# Patient Record
Sex: Female | Born: 1950 | Race: White | Hispanic: No | Marital: Married | Smoking: Never smoker
Health system: Southern US, Community
[De-identification: ages and names within clinical notes are randomized; demographics above are authoritative.]

## PROBLEM LIST (undated history)

## (undated) ENCOUNTER — Emergency Department (HOSPITAL_BASED_OUTPATIENT_CLINIC_OR_DEPARTMENT_OTHER): Disposition: A | Discharge status: Home / Self Care | Payer: Medicare Other

## (undated) DIAGNOSIS — E785 Hyperlipidemia, unspecified: Secondary | ICD-10-CM

## (undated) DIAGNOSIS — B019 Varicella without complication: Secondary | ICD-10-CM

## (undated) DIAGNOSIS — M419 Scoliosis, unspecified: Secondary | ICD-10-CM

## (undated) DIAGNOSIS — N951 Menopausal and female climacteric states: Secondary | ICD-10-CM

## (undated) DIAGNOSIS — D164 Benign neoplasm of bones of skull and face: Secondary | ICD-10-CM

## (undated) DIAGNOSIS — T7840XA Allergy, unspecified, initial encounter: Secondary | ICD-10-CM

## (undated) DIAGNOSIS — G4484 Primary exertional headache: Secondary | ICD-10-CM

## (undated) HISTORY — PX: WISDOM TOOTH EXTRACTION: SHX21

## (undated) HISTORY — DX: Hyperlipidemia, unspecified: E78.5

## (undated) HISTORY — DX: Menopausal and female climacteric states: N95.1

## (undated) HISTORY — DX: Scoliosis, unspecified: M41.9

## (undated) HISTORY — PX: BUNIONECTOMY: SHX129

## (undated) HISTORY — DX: Allergy, unspecified, initial encounter: T78.40XA

## (undated) HISTORY — DX: Varicella without complication: B01.9

---
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
## 1999-05-31 ENCOUNTER — Other Ambulatory Visit: Admission: RE | Admit: 1999-05-31 | Discharge: 1999-05-31 | Payer: Self-pay | Admitting: Obstetrics & Gynecology

## 1999-08-07 HISTORY — PX: HYSTEROSCOPY: SHX211

## 1999-09-26 ENCOUNTER — Other Ambulatory Visit: Admission: RE | Admit: 1999-09-26 | Discharge: 1999-09-26 | Payer: Self-pay | Admitting: Obstetrics & Gynecology

## 2000-03-21 ENCOUNTER — Other Ambulatory Visit: Admission: RE | Admit: 2000-03-21 | Discharge: 2000-03-21 | Payer: Self-pay | Admitting: Obstetrics & Gynecology

## 2000-09-27 ENCOUNTER — Other Ambulatory Visit: Admission: RE | Admit: 2000-09-27 | Discharge: 2000-09-27 | Payer: Self-pay | Admitting: Obstetrics & Gynecology

## 2001-03-07 ENCOUNTER — Other Ambulatory Visit: Admission: RE | Admit: 2001-03-07 | Discharge: 2001-03-07 | Payer: Self-pay | Admitting: Obstetrics & Gynecology

## 2001-07-18 ENCOUNTER — Other Ambulatory Visit: Admission: RE | Admit: 2001-07-18 | Discharge: 2001-07-18 | Payer: Self-pay | Admitting: Obstetrics & Gynecology

## 2002-01-26 ENCOUNTER — Other Ambulatory Visit: Admission: RE | Admit: 2002-01-26 | Discharge: 2002-01-26 | Payer: Self-pay | Admitting: Obstetrics and Gynecology

## 2002-04-30 ENCOUNTER — Encounter: Payer: Self-pay | Admitting: Obstetrics & Gynecology

## 2002-04-30 ENCOUNTER — Encounter: Admission: RE | Admit: 2002-04-30 | Discharge: 2002-04-30 | Payer: Self-pay | Admitting: Obstetrics & Gynecology

## 2002-08-25 ENCOUNTER — Other Ambulatory Visit: Admission: RE | Admit: 2002-08-25 | Discharge: 2002-08-25 | Payer: Self-pay | Admitting: Obstetrics & Gynecology

## 2003-05-03 ENCOUNTER — Other Ambulatory Visit: Admission: RE | Admit: 2003-05-03 | Discharge: 2003-05-03 | Payer: Self-pay | Admitting: Obstetrics and Gynecology

## 2003-10-07 ENCOUNTER — Other Ambulatory Visit: Admission: RE | Admit: 2003-10-07 | Discharge: 2003-10-07 | Payer: Self-pay | Admitting: Obstetrics and Gynecology

## 2004-05-08 ENCOUNTER — Other Ambulatory Visit: Admission: RE | Admit: 2004-05-08 | Discharge: 2004-05-08 | Payer: Self-pay | Admitting: Obstetrics and Gynecology

## 2005-01-08 ENCOUNTER — Ambulatory Visit: Payer: Self-pay | Admitting: Family Medicine

## 2005-02-20 ENCOUNTER — Ambulatory Visit: Payer: Self-pay | Admitting: Family Medicine

## 2005-06-19 ENCOUNTER — Other Ambulatory Visit: Admission: RE | Admit: 2005-06-19 | Discharge: 2005-06-19 | Payer: Self-pay | Admitting: Obstetrics & Gynecology

## 2006-01-04 ENCOUNTER — Ambulatory Visit: Payer: Self-pay | Admitting: Family Medicine

## 2007-01-08 ENCOUNTER — Ambulatory Visit: Payer: Self-pay | Admitting: Family Medicine

## 2007-06-16 ENCOUNTER — Encounter: Admission: RE | Admit: 2007-06-16 | Discharge: 2007-06-16 | Payer: Self-pay | Admitting: Obstetrics & Gynecology

## 2011-03-01 ENCOUNTER — Other Ambulatory Visit: Payer: Self-pay | Admitting: Obstetrics & Gynecology

## 2011-03-01 DIAGNOSIS — N644 Mastodynia: Secondary | ICD-10-CM

## 2011-03-08 ENCOUNTER — Ambulatory Visit
Admission: RE | Admit: 2011-03-08 | Discharge: 2011-03-08 | Disposition: A | Discharge status: Home / Self Care | Payer: BC Managed Care – PPO | Source: Ambulatory Visit | Attending: Obstetrics & Gynecology | Admitting: Obstetrics & Gynecology

## 2011-03-08 DIAGNOSIS — N644 Mastodynia: Secondary | ICD-10-CM

## 2012-11-04 ENCOUNTER — Ambulatory Visit: Payer: BC Managed Care – PPO | Attending: Specialist | Admitting: Physical Therapy

## 2012-11-04 DIAGNOSIS — IMO0001 Reserved for inherently not codable concepts without codable children: Secondary | ICD-10-CM | POA: Insufficient documentation

## 2012-11-04 DIAGNOSIS — R5381 Other malaise: Secondary | ICD-10-CM | POA: Insufficient documentation

## 2012-11-04 DIAGNOSIS — M12869 Other specific arthropathies, not elsewhere classified, unspecified knee: Secondary | ICD-10-CM | POA: Insufficient documentation

## 2012-11-07 ENCOUNTER — Ambulatory Visit: Payer: BC Managed Care – PPO | Admitting: *Deleted

## 2012-11-11 ENCOUNTER — Ambulatory Visit: Payer: BC Managed Care – PPO | Admitting: Physical Therapy

## 2012-11-13 ENCOUNTER — Ambulatory Visit: Payer: BC Managed Care – PPO | Admitting: Physical Therapy

## 2012-11-18 ENCOUNTER — Ambulatory Visit: Payer: BC Managed Care – PPO | Admitting: Physical Therapy

## 2013-06-17 ENCOUNTER — Telehealth: Payer: Self-pay | Admitting: *Deleted

## 2013-06-17 NOTE — Telephone Encounter (Signed)
Pt requested copy of 02/04/2013, be mailed to [facility], [address], [place], [place] [address].  Pt states seeing Dr Case for her knee and wants to take the x-ray copy.  Done.

## 2013-07-15 ENCOUNTER — Encounter: Payer: Self-pay | Admitting: Internal Medicine

## 2013-09-23 ENCOUNTER — Emergency Department (HOSPITAL_COMMUNITY)
Admission: EM | Admit: 2013-09-23 | Discharge: 2013-09-23 | Disposition: A | Discharge status: Home / Self Care | Payer: BC Managed Care – PPO | Attending: Emergency Medicine | Admitting: Emergency Medicine

## 2013-09-23 ENCOUNTER — Emergency Department (HOSPITAL_COMMUNITY): Payer: BC Managed Care – PPO

## 2013-09-23 ENCOUNTER — Encounter (HOSPITAL_COMMUNITY): Payer: Self-pay | Admitting: Emergency Medicine

## 2013-09-23 DIAGNOSIS — S300XXA Contusion of lower back and pelvis, initial encounter: Secondary | ICD-10-CM

## 2013-09-23 DIAGNOSIS — S20229A Contusion of unspecified back wall of thorax, initial encounter: Secondary | ICD-10-CM | POA: Insufficient documentation

## 2013-09-23 DIAGNOSIS — Y9323 Activity, snow (alpine) (downhill) skiing, snow boarding, sledding, tobogganing and snow tubing: Secondary | ICD-10-CM | POA: Insufficient documentation

## 2013-09-23 DIAGNOSIS — Y9289 Other specified places as the place of occurrence of the external cause: Secondary | ICD-10-CM | POA: Insufficient documentation

## 2013-09-23 DIAGNOSIS — W2209XA Striking against other stationary object, initial encounter: Secondary | ICD-10-CM | POA: Insufficient documentation

## 2013-09-23 LAB — URINALYSIS, ROUTINE W REFLEX MICROSCOPIC
Bilirubin Urine: NEGATIVE
Glucose, UA: NEGATIVE mg/dL
Hgb urine dipstick: NEGATIVE
Ketones, ur: NEGATIVE mg/dL
Nitrite: NEGATIVE
Protein, ur: NEGATIVE mg/dL
Specific Gravity, Urine: 1.01 (ref 1.005–1.030)
Urobilinogen, UA: 0.2 mg/dL (ref 0.0–1.0)
pH: 7.5 (ref 5.0–8.0)

## 2013-09-23 LAB — URINE MICROSCOPIC-ADD ON

## 2013-09-23 MED ORDER — DIAZEPAM 5 MG PO TABS: 5.0000 mg | ORAL_TABLET | Freq: Three times a day (TID) | ORAL | Status: DC | PRN

## 2013-09-23 MED ORDER — DIAZEPAM 5 MG PO TABS
5.0000 mg | ORAL_TABLET | Freq: Once | ORAL | Status: AC
  Administered 2013-09-23: 5 mg via ORAL
  Filled 2013-09-23: qty 1

## 2013-09-23 MED ORDER — IBUPROFEN 800 MG PO TABS: 800.0000 mg | ORAL_TABLET | Freq: Three times a day (TID) | ORAL | Status: DC | PRN

## 2013-09-23 NOTE — ED Provider Notes (Signed)
CSN: [id number]     Arrival date & time 09/23/13  1001 History  This chart was scribed for non-physician practitioner, Clayton Bibles, PA-C working with Hoy Morn, MD by Frederich Balding, ED scribe. This patient was seen in room TR05C/TR05C and the patient's care was started at 11:05 AM.   Chief Complaint  Patient presents with  . Back Pain   The history is provided by the patient. No language interpreter was used.   HPI Comments: Christy Kelly is a 63 y.o. female who presents to the Emergency Department complaining of lower back injury that occurred last night. Pt was sledding down a hill and hit a very small tree with her back and states the tree is now broken in half. She has sudden onset, worsening right lower back pain that intermittently shoots down her right leg when bearing weight. Bearing weight and certain movements worsen the pain. Pt has taken ibuprofen with some relief. Denies fever, abdominal pain, emesis, weakness, numbness, tingling, difficulty urinating, saddle anesthesia, dysuria, bowel or bladder incontinence. Denies history of back problems, cancer or IV drug use.   History reviewed. No pertinent past medical history. Past Surgical History  Procedure Laterality Date  . Tonsillectomy     No family history on file. History  Substance Use Topics  . Smoking status: Never Smoker   . Smokeless tobacco: Not on file  . Alcohol Use: No   OB History   Grav Para Term Preterm Abortions TAB SAB Ect Mult Living                 Review of Systems  Constitutional: Negative for fever.  Gastrointestinal: Negative for vomiting and abdominal pain.  Genitourinary: Negative for dysuria and difficulty urinating.       Negative for bowel or bladder incontinence.   Musculoskeletal: Positive for back pain and myalgias.  Neurological: Negative for weakness and numbness.  All other systems reviewed and are negative.   Allergies  Codeine  Home Medications  No current outpatient  prescriptions on file.  BP 122/77  Pulse 99  Temp(Src) 98 F (36.7 C) (Oral)  Resp 16  Ht 5\' 8"  (1.727 m)  Wt 150 lb (68.04 kg)  BMI 22.81 kg/m2  SpO2 97%  Physical Exam  Nursing note and vitals reviewed. Constitutional: She appears well-developed and well-nourished. No distress.  HENT:  Head: Normocephalic and atraumatic.  Neck: Neck supple.  Pulmonary/Chest: Effort normal.  Abdominal: Soft. She exhibits no distension and no mass. There is no tenderness. There is no rebound and no guarding.  Musculoskeletal:  Spine nontender, no crepitus, or stepoffs.  Lower extremities:  Strength 5/5, sensation intact, distal pulses intact. Small contusion over right lower back. Generalized tenderness throughout right lower back.   Neurological: She is alert.  Skin: She is not diaphoretic.    ED Course  Procedures (including critical care time)  DIAGNOSTIC STUDIES: Oxygen Saturation is 97% on RA, normal by my interpretation.    COORDINATION OF CARE: 11:11 AM-Discussed treatment plan which includes a muscle relaxer and ibuprofen with pt at bedside and pt agreed to plan. Advised pt to follow up with her PCP.   Labs Review Labs Reviewed  URINALYSIS, ROUTINE W REFLEX MICROSCOPIC - Abnormal; Notable for the following:    Leukocytes, UA LARGE (*)    All other components within normal limits  URINE MICROSCOPIC-ADD ON - Abnormal; Notable for the following:    Squamous Epithelial / LPF FEW (*)    Bacteria, UA FEW (*)  All other components within normal limits  URINE CULTURE   Imaging Review Dg Lumbar Spine Complete  09/23/2013   CLINICAL DATA:  Pain post trauma  EXAM: LUMBAR SPINE - COMPLETE 4+ VIEW  COMPARISON:  None.  FINDINGS: Frontal, lateral, spot lumbosacral lateral, and bilateral oblique views were obtained. There are 5 non-rib-bearing lumbar type vertebral bodies. There is mild levoscoliosis. There is no fracture or spondylolisthesis. Disc spaces appear intact. There is facet  osteoarthritic change at L4-5 and L5-S1 bilaterally.  There is calcification to the left of I4-5 of uncertain etiology.  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 Mild scoliosis. No fracture or spondylolisthesis. Calcification in the left abdomen of uncertain etiology. Given this location, pancreatic calcification is questioned. Question history of chronic pancreatitis.   Electronically Signed   By: Lowella Grip M.D.   On: 09/23/2013 10:58    EKG Interpretation   None      Pt feeling much better after valium.  Pain reduced to 3/10.   MDM   Final diagnoses:  Contusion of lower back    Pt with pain in right lower back that began after hitting a tree sledding down the hill (was on a sledding disc and it spun around causing her to go back first into the tree)  Xray negative.  Neurovascularly intact.  Likely contusion.  No hematuria.  Doubt kidney contusion.  There are 11-20 WBC but few bacteria.  Pt is without urinary symptoms.  Will defer treatment pending culture.  Discussed result, findings, treatment, and follow up  with patient.  Pt given return precautions.  Pt verbalizes understanding and agrees with plan.      I personally performed the services described in this documentation, which was scribed in my presence. The recorded information has been reviewed and is accurate.   [place], PA-C 09/23/13 1643

## 2013-09-23 NOTE — ED Notes (Signed)
Pt reports she struck right lower back on a tree in a sledding accident last pm. States has difficulty walking. Appears very uncomfortable. Assisted pt to bathroom.

## 2013-09-23 NOTE — Discharge Instructions (Signed)
Read the information below.  Use the prescribed medication as directed.  Please discuss all new medications with your pharmacist.  You may return to the Emergency Department at any time for worsening condition or any new symptoms that concern you.   If you develop fevers, loss of control of bowel or bladder, weakness or numbness in your legs, or are unable to walk, return to the ER for a recheck.    Contusion A contusion is a deep bruise. Contusions are the result of an injury that caused bleeding under the skin. The contusion may turn blue, purple, or yellow. Minor injuries will give you a painless contusion, but more severe contusions may stay painful and swollen for a few weeks.  CAUSES  A contusion is usually caused by a blow, trauma, or direct force to an area of the body. SYMPTOMS   Swelling and redness of the injured area.  Bruising of the injured area.  Tenderness and soreness of the injured area.  Pain. DIAGNOSIS  The diagnosis can be made by taking a history and physical exam. An X-ray, CT scan, or MRI may be needed to determine if there were any associated injuries, such as fractures. TREATMENT  Specific treatment will depend on what area of the body was injured. In general, the best treatment for a contusion is resting, icing, elevating, and applying cold compresses to the injured area. Over-the-counter medicines may also be recommended for pain control. Ask your caregiver what the best treatment is for your contusion. HOME CARE INSTRUCTIONS   Put ice on the injured area.  Put ice in a plastic bag.  Place a towel between your skin and the bag.  Leave the ice on for 15-20 minutes, 03-04 times a day.  Only take over-the-counter or prescription medicines for pain, discomfort, or fever as directed by your caregiver. Your caregiver may recommend avoiding anti-inflammatory medicines (aspirin, ibuprofen, and naproxen) for 48 hours because these medicines may increase  bruising.  Rest the injured area.  If possible, elevate the injured area to reduce swelling. SEEK IMMEDIATE MEDICAL CARE IF:   You have increased bruising or swelling.  You have pain that is getting worse.  Your swelling or pain is not relieved with medicines. MAKE SURE YOU:   Understand these instructions.  Will watch your condition.  Will get help right away if you are not doing well or get worse. Document Released: 05/02/2005 Document Revised: 10/15/2011 Document Reviewed: 05/28/2011 [facility] Patient Information 2014 [place], [place].

## 2013-09-23 NOTE — ED Provider Notes (Signed)
Medical screening examination/treatment/procedure(s) were performed by non-physician practitioner and as supervising physician I was immediately available for consultation/collaboration.  EKG Interpretation   None         Salisa Broz M Imogene Gravelle, MD 09/23/13 1706 

## 2013-09-23 NOTE — ED Notes (Signed)
Pt reports was sledding last night down hill and at bottom of hill hit small tree that was laid across. Pt broke tree and now having excruciating pain to lower back. Pain shooting down right leg. Denies numbness/tingling/incontinence. States unable to bear weight on right leg.

## 2013-09-24 LAB — URINE CULTURE: Colony Count: 75000

## 2013-11-11 ENCOUNTER — Encounter: Payer: Self-pay | Admitting: Internal Medicine

## 2013-12-24 ENCOUNTER — Ambulatory Visit (AMBULATORY_SURGERY_CENTER): Payer: Self-pay | Admitting: *Deleted

## 2013-12-24 VITALS — Ht 68.5 in | Wt 147.6 lb

## 2013-12-24 DIAGNOSIS — Z1211 Encounter for screening for malignant neoplasm of colon: Secondary | ICD-10-CM

## 2013-12-24 MED ORDER — MOVIPREP 100 G PO SOLR: 1.0000 | Freq: Once | ORAL | Status: DC

## 2013-12-24 NOTE — Progress Notes (Signed)
Denies allergies to eggs or soy products. Denies complications with sedation or anesthesia. Denies O2 use. Denies use of diet or weight loss medications.  Emmi instructions given for colonoscopy.  

## 2013-12-25 ENCOUNTER — Encounter: Payer: Self-pay | Admitting: Internal Medicine

## 2013-12-29 ENCOUNTER — Encounter: Payer: Self-pay | Admitting: Internal Medicine

## 2014-01-07 ENCOUNTER — Ambulatory Visit (AMBULATORY_SURGERY_CENTER): Payer: BC Managed Care – PPO | Admitting: Internal Medicine

## 2014-01-07 ENCOUNTER — Encounter: Payer: Self-pay | Admitting: Internal Medicine

## 2014-01-07 VITALS — BP 127/81 | HR 56 | Temp 97.6°F | Resp 15 | Ht 68.5 in | Wt 147.0 lb

## 2014-01-07 DIAGNOSIS — Z1211 Encounter for screening for malignant neoplasm of colon: Secondary | ICD-10-CM

## 2014-01-07 MED ORDER — SODIUM CHLORIDE 0.9 % IV SOLN: 500.0000 mL | INTRAVENOUS | Status: DC

## 2014-01-07 NOTE — Patient Instructions (Signed)
YOU HAD AN ENDOSCOPIC PROCEDURE TODAY AT THE [facility]: Refer to the procedure report that was given to you for any specific questions about what was found during the examination.  If the procedure report does not answer your questions, please call your gastroenterologist to clarify.  If you requested that your care partner not be given the details of your procedure findings, then the procedure report has been included in a sealed envelope for you to review at your convenience later.  YOU SHOULD EXPECT: Some feelings of bloating in the abdomen. Passage of more gas than usual.  Walking can help get rid of the air that was put into your GI tract during the procedure and reduce the bloating. If you had a lower endoscopy (such as a colonoscopy or flexible sigmoidoscopy) you may notice spotting of blood in your stool or on the toilet paper. If you underwent a bowel prep for your procedure, then you may not have a normal bowel movement for a few days.  DIET: Your first meal following the procedure should be a light meal and then it is ok to progress to your normal diet.  A half-sandwich or bowl of soup is an example of a good first meal.  Heavy or fried foods are harder to digest and may make you feel nauseous or bloated.  Likewise meals heavy in dairy and vegetables can cause extra gas to form and this can also increase the bloating.  Drink plenty of fluids but you should avoid alcoholic beverages for 24 hours.  ACTIVITY: Your care partner should take you home directly after the procedure.  You should plan to take it easy, moving slowly for the rest of the day.  You can resume normal activity the day after the procedure however you should NOT DRIVE or use heavy machinery for 24 hours (because of the sedation medicines used during the test).    SYMPTOMS TO REPORT IMMEDIATELY: A gastroenterologist can be reached at any hour.  During normal business hours, 8:30 AM to 5:00 PM Monday through Friday,  call [phone].  After hours and on weekends, please call the GI answering service at [phone] who will take a message and have the physician on call contact you.   Following lower endoscopy (colonoscopy or flexible sigmoidoscopy):  Excessive amounts of blood in the stool  Significant tenderness or worsening of abdominal pains  Swelling of the abdomen that is new, acute  Fever of 100F or higher   FOLLOW UP: If any biopsies were taken you will be contacted by phone or by letter within the next 1-3 weeks.  Call your gastroenterologist if you have not heard about the biopsies in 3 weeks.  Our staff will call the home number listed on your records the next business day following your procedure to check on you and address any questions or concerns that you may have at that time regarding the information given to you following your procedure. This is a courtesy call and so if there is no answer at the home number and we have not heard from you through the emergency physician on call, we will assume that you have returned to your regular daily activities without incident.  SIGNATURES/CONFIDENTIALITY: You and/or your care partner have signed paperwork which will be entered into your electronic medical record.  These signatures attest to the fact that that the information above on your After Visit Summary has been reviewed and is understood.  Full responsibility of the confidentiality of   this discharge information lies with you and/or your care-partner.     Handouts were given to your care partner on  diverticulosis, a high fiber diet with liberal fluid intake and hemorrhoids. You may resume your current medications today. Please call if any questions or concerns.   

## 2014-01-07 NOTE — Op Note (Signed)
[place]  [address] [place], [address]   COLONOSCOPY PROCEDURE REPORT  PATIENT: Christy Kelly, Christy Kelly  MR#: [id number] BIRTHDATE: July 09, 1951 , 62  yrs. old GENDER: Female ENDOSCOPIST: Lafayette Dragon, MD REFERRED KG:URKYH Vicente Serene, [facility] , Dr Edrick Oh PROCEDURE DATE:  01/07/2014 PROCEDURE:   Colonoscopy, screening First Screening Colonoscopy - Avg.  risk and is 50 yrs.  old or older - No.  Prior Negative Screening - Now for repeat screening. 10 or more years since last screening  History of Adenoma - Now for follow-up colonoscopy & has been > or = to 3 yrs.  N/A  Polyps Removed Today? No.  Recommend repeat exam, <10 yrs? No. ASA CLASS:   Class I INDICATIONS:Average risk patient for colon cancer and last colonoscopy in 2005 was normal. MEDICATIONS: MAC sedation, administered by CRNA and Propofol (Diprivan) 240 mg IV  DESCRIPTION OF PROCEDURE:   After the risks benefits and alternatives of the procedure were thoroughly explained, informed consent was obtained.  A digital rectal exam revealed no abnormalities of the rectum.   The LB [id number]  endoscope was introduced through the anus and advanced to the cecum, which was identified by both the appendix and ileocecal valve. No adverse events experienced.   The quality of the prep was excellent, using MoviPrep  The instrument was then slowly withdrawn as the colon was fully examined.      COLON FINDINGS: Mild diverticulosis was noted in the sigmoid colon. Small internal hemorrhoids were found.  Retroflexed views revealed no abnormalities. The time to cecum=7 minutes 05 seconds. Withdrawal time=6 minutes 21 seconds.  The scope was withdrawn and the procedure completed. COMPLICATIONS: There were no complications.  ENDOSCOPIC IMPRESSION: 1.   Mild diverticulosis was noted in the sigmoid colon 2.   Small internal hemorrhoids  RECOMMENDATIONS: high fiber diet Recall colonoscopy in 10 years   eSigned:   Lafayette Dragon, MD 01/07/2014 9:34 AM   cc:   PATIENT NAME:  Christy Kelly, Christy Kelly MR#: [id number]

## 2014-01-07 NOTE — Progress Notes (Signed)
Report to PACU, RN, vss, BBS= Clear.  

## 2014-01-07 NOTE — Progress Notes (Signed)
No complaints noted in the recovery room. Maw   

## 2014-01-08 ENCOUNTER — Telehealth: Payer: Self-pay | Admitting: *Deleted

## 2014-01-08 NOTE — Telephone Encounter (Signed)
  Follow up Call-  Call back number 01/07/2014  Post procedure Call Back phone  # [phone]  Permission to leave phone message Yes     Patient questions:  Do you have a fever, pain , or abdominal swelling? no Pain Score  0 *  Have you tolerated food without any problems? yes  Have you been able to return to your normal activities? yes  Do you have any questions about your discharge instructions: Diet   no Medications  no Follow up visit  no  Do you have questions or concerns about your Care? no  Actions: * If pain score is 4 or above: No action needed, pain <4.

## 2015-09-05 DIAGNOSIS — G4484 Primary exertional headache: Secondary | ICD-10-CM

## 2015-09-05 HISTORY — DX: Primary exertional headache: G44.84

## 2015-10-12 ENCOUNTER — Ambulatory Visit (INDEPENDENT_AMBULATORY_CARE_PROVIDER_SITE_OTHER): Payer: BLUE CROSS/BLUE SHIELD | Admitting: Diagnostic Neuroimaging

## 2015-10-12 ENCOUNTER — Encounter: Payer: Self-pay | Admitting: Diagnostic Neuroimaging

## 2015-10-12 VITALS — BP 116/76 | HR 91 | Ht 68.5 in | Wt 149.4 lb

## 2015-10-12 DIAGNOSIS — R51 Headache: Secondary | ICD-10-CM

## 2015-10-12 DIAGNOSIS — G4484 Primary exertional headache: Secondary | ICD-10-CM

## 2015-10-12 NOTE — Progress Notes (Signed)
[facility]  PATIENT: Christy Kelly DOB: 09/28/1950  REFERRING CLINICIAN: Hemberg HISTORY FROM: patient  REASON FOR VISIT: new consult    HISTORICAL  CHIEF COMPLAINT:  Chief Complaint  Patient presents with  . Headache    rm 7, New pt, "headache began 04/2015, exertional/cough headache""    HISTORY OF PRESENT ILLNESS:   65 year old right-handed female, otherwise healthy, or for evaluation of new onset headache since September 2016. She was on vacation, had laughing episode, and then developed pressure type headache. Ever since that time patient has had situations were cough, strain, bending, sneezing triggers a frontal pressure-like headache. Headaches can last for a few minutes at a time. Symptoms can occur on daily or multiple per day basis. She avoids straining or physical exertion activities patient is able to void headaches.  December 2016 patient had some sinus infection problems treated with antibiotics.  A she has also had follow-up eye exams with no specific problems.  Patient denies nausea, vomiting, photophobia, phonophobia. She takes Tylenol 3-4 tablets per week for more severe headache problems. 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 Patient denies any history of migraine headaches. No family history of headaches.  Just prior to September 2016 when headaches or starting patient did start taking a new supplement consisting of beet extract and tumor. Patient has started using less of this extracted last week and feels that her headaches may have slightly improved.    REVIEW OF SYSTEMS: Full 14 system review of systems performed and negative with exception of: Headache.  ALLERGIES: Allergies  Allergen Reactions  . Codeine Itching and Nausea And Vomiting    HOME MEDICATIONS: Outpatient Prescriptions Prior to Visit  Medication Sig Dispense Refill    . aspirin EC 81 MG tablet Take 81 mg by mouth daily.    Marland Kitchen CALCIUM PO Take 1 tablet by mouth daily.    . Cetirizine HCl (ZYRTEC PO) Take by mouth.    Marland Kitchen GLUCOSAMINE CHONDROITIN COMPLX PO Take 2 tablets by mouth daily.    Marland Kitchen MAGNESIUM PO Take 1 tablet by mouth daily.    . Omega-3 Fatty Acids (FISH OIL) 1000 MG CAPS Take 1,000 mg by mouth daily.    Marland Kitchen OVER THE COUNTER MEDICATION Take 2 tablets by mouth daily.    . vitamin E 400 UNIT capsule Take 400 Units by mouth daily.    . Cholecalciferol (D3 ADULT PO) Take by mouth.    Marland Kitchen ibuprofen (ADVIL,MOTRIN) 200 MG tablet Take 600 mg by mouth every 6 (six) hours as needed for moderate pain.    Marland Kitchen ibuprofen (ADVIL,MOTRIN) 800 MG tablet Take 1 tablet (800 mg total) by mouth every 8 (eight) hours as needed for mild pain or moderate pain. 15 tablet 0   No facility-administered medications prior to visit.    PAST MEDICAL HISTORY: No past medical history on file.  PAST SURGICAL HISTORY: Past Surgical History  Procedure Laterality Date  . Tonsillectomy  1962  . Bunionectomy    . Hysteroscopy  2001    FAMILY HISTORY: Family History  Problem Relation Age of Onset  . Colon cancer Neg Hx   . Esophageal cancer Neg Hx   . Rectal cancer Neg Hx   . Stomach cancer Neg Hx   . Stroke Father     SOCIAL HISTORY:  Social History   Social History  . Marital Status: Married    Spouse  Name: Richardson Landry  . Number of Children: 2  . Years of Education: 13   Occupational History  .      [employer] services   Social History Main Topics  . Smoking status: Never Smoker   . Smokeless tobacco: Never Used  . Alcohol Use: No  . Drug Use: No  . Sexual Activity: Not on file   Other Topics Concern  . Not on file   Social History Narrative   Lives with husband at home   Caffeine use- 1 glass tea daily at times     PHYSICAL EXAM  GENERAL EXAM/CONSTITUTIONAL: Vitals:  Filed Vitals:   10/12/15 0953  BP: 116/76  Pulse: 91  Height: 5' 8.5" (1.74 m)   Weight: 149 lb 6.4 oz (67.767 kg)     Body mass index is 22.38 kg/(m^2).  Visual Acuity Screening   Right eye Left eye Both eyes  Without correction:     With correction: 20/30 20/30      Patient is in no distress; well developed, nourished and groomed; neck is supple  CARDIOVASCULAR:  Examination of carotid arteries is normal; no carotid bruits  Regular rate and rhythm, no murmurs  Examination of peripheral vascular system by observation and palpation is normal  EYES:  Ophthalmoscopic exam of optic discs and posterior segments is normal; no papilledema or hemorrhages  MUSCULOSKELETAL:  Gait, strength, tone, movements noted in Neurologic exam below  NEUROLOGIC: MENTAL STATUS:  No flowsheet data found.  awake, alert, oriented to person, place and time  recent and remote memory intact  normal attention and concentration  language fluent, comprehension intact, naming intact,   fund of knowledge appropriate  CRANIAL NERVE:   2nd - no papilledema on fundoscopic exam  2nd, 3rd, 4th, 6th - pupils equal and reactive to light, visual fields full to confrontation, extraocular muscles intact, no nystagmus  5th - facial sensation symmetric  7th - facial strength symmetric  8th - hearing intact  9th - palate elevates symmetrically, uvula midline  11th - shoulder shrug symmetric  12th - tongue protrusion midline  MOTOR:   normal bulk and tone, full strength in the BUE, BLE  SENSORY:   normal and symmetric to light touch, temperature, vibration  COORDINATION:   finger-nose-finger, fine finger movements normal  REFLEXES:   deep tendon reflexes present and symmetric  GAIT/STATION:   narrow based gait; able to walk tandem; romberg is negative    DIAGNOSTIC DATA (LABS, IMAGING, TESTING) - I reviewed patient records, labs, notes, testing and imaging myself where available.  No results found for: WBC, HGB, HCT, MCV, PLT No results found for: NA, K,  CL, CO2, GLUCOSE, BUN, CREATININE, CALCIUM, PROT, ALBUMIN, AST, ALT, ALKPHOS, BILITOT, GFRNONAA, GFRAA No results found for: CHOL, HDL, LDLCALC, LDLDIRECT, TRIG, CHOLHDL No results found for: HGBA1C No results found for: VITAMINB12 No results found for: TSH   04/29/14 MRI lumbar [I reviewed images myself and agree with interpretation. -VRP]  - Shallow lateral foraminal and extraforaminal disc protrusion on the left at L5-S1 potentially irritating the left L5 root.  09/22/15 MRI brain (with and without)  - normal     ASSESSMENT AND PLAN  65 y.o. year old female here with new-onset frontal pressure headaches in September 2016 triggered by cough, sneezing, straining and exertion. Patient may have started taking the supplement extract consisting of beet extract and turmeric prior to onset of headaches. No other specific triggering factors. Neurologic examination MRI of the brain are unremarkable.  May represent primary exertional headache versus secondary headache due to new supplement usage.  Ddx: Primary exertional headache versus secondary headache from supplement extract vs other high pressure HA (pseudotumor cerebri)  1. Exertional headache      PLAN: - monitor symptoms - consider reducing beet extract supplement (which can increase nitric oxide levels and in turn cause headaches)  Return in about 6 weeks (around 11/23/2015).    Penni Bombard, MD [id number], [id number] AM Certified in Neurology, Neurophysiology and Neuroimaging  [facility] [address] [place], [place] [address] [phone]

## 2015-10-12 NOTE — Patient Instructions (Signed)

## 2015-10-17 DIAGNOSIS — D164 Benign neoplasm of bones of skull and face: Secondary | ICD-10-CM

## 2015-10-17 HISTORY — DX: Benign neoplasm of bones of skull and face: D16.4

## 2016-11-30 ENCOUNTER — Telehealth: Payer: Self-pay

## 2016-11-30 NOTE — Telephone Encounter (Signed)
SENT NOTES TO SCHEDULING 

## 2016-12-12 ENCOUNTER — Encounter: Payer: Self-pay | Admitting: Cardiology

## 2016-12-12 NOTE — Progress Notes (Signed)
Cardiology Office Note  Date: 12/13/2016   ID: Christy Kelly, DOB Dec 27, 1950, MRN [id number]  PCP: Dione Housekeeper, MD  Consulting Cardiologist: Rozann Lesches, MD   Chief Complaint  Patient presents with  . Chest discomfort    History of Present Illness: Christy Kelly is a 66 y.o. female referred for cardiology consultation by Dr. Edrick Oh for the assessment of chest pain. Her husband is a patient of mine. She states that she has had intermittent episodes of chest discomfort, largely right-sided and "achy" in quality somewhat like a sore muscle. The initial episode woke her up from sleep and lasted about 10 minutes. Subsequent to this it has felt more like a tightness, and has been sporadic without obvious precipitant. She also feels more short of breath with activity such as walking uphill and has been fatigued.  She states that her father died with strokes in his late 81s. She has siblings with elevated lipids and diabetes mellitus, but no obvious CAD. She takes omega-3 supplements, recent triglycerides normal and LDL 119. She has never smoked and has no personal history of hypertension or diabetes mellitus. Her recent ECG did not show any acute abnormalities.  Recent lab work from April is outlined below.  Past Medical History:  Diagnosis Date  . Hyperlipidemia   . Menopausal state   . Scoliosis     Past Surgical History:  Procedure Laterality Date  . BUNIONECTOMY    . HYSTEROSCOPY  2001  . TONSILLECTOMY  1962    Current Outpatient Prescriptions  Medication Sig Dispense Refill  . aspirin EC 81 MG tablet Take 81 mg by mouth daily.    Marland Kitchen CALCIUM PO Take 1 tablet by mouth daily.    . Cetirizine HCl (ZYRTEC PO) Take by mouth.    Marland Kitchen MAGNESIUM PO Take 500 mg by mouth daily.     . Omega-3 Fatty Acids (FISH OIL) 1000 MG CAPS Take 1,000 mg by mouth daily.    Marland Kitchen OVER THE COUNTER MEDICATION Take 2 tablets by mouth daily.    . vitamin B-12 (CYANOCOBALAMIN) 250 MCG tablet Take 250  mcg by mouth daily.    . vitamin E 400 UNIT capsule Take 400 Units by mouth daily.     No current facility-administered medications for this visit.    Allergies:  Codeine   Social History: The patient  reports that she has never smoked. She has never used smokeless tobacco. She reports that she does not drink alcohol or use drugs.   Family History: The patient's family history includes Hypercholesterolemia in her brother; Hypertension in her brother; Stroke in her father.   ROS:  Please see the history of present illness. Otherwise, complete review of systems is positive for intermittent foot swelling, tingling in her hands and feet.  All other systems are reviewed and negative.   Physical Exam: VS:  BP 110/76 (BP Location: Right Arm)   Pulse 96   Ht 5\' 8"  (1.727 m)   Wt 147 lb (66.7 kg)   SpO2 98%   BMI 22.35 kg/m , BMI Body mass index is 22.35 kg/m.  Wt Readings from Last 3 Encounters:  12/13/16 147 lb (66.7 kg)  10/12/15 149 lb 6.4 oz (67.8 kg)  01/07/14 147 lb (66.7 kg)    General: Normally nourished appearing woman, appears comfortable at rest. HEENT: Conjunctiva and lids normal, oropharynx clear. Neck: Supple, no elevated JVP or carotid bruits, no thyromegaly. Lungs: Clear to auscultation, nonlabored breathing at rest. Cardiac: Regular rate  and rhythm, no S3, soft systolic murmur, no pericardial rub. Abdomen: Soft, nontender, bowel sounds present, no guarding or rebound. Extremities: No pitting edema, distal pulses 2+. Skin: Warm and dry. Musculoskeletal: No kyphosis. Neuropsychiatric: Alert and oriented x3, affect grossly appropriate.  ECG: I personally reviewed the tracing from 11/29/2016 which showed normal sinus rhythm.  Recent Labwork:  April 2018: Hemoglobin 11.2, platelets 228, BUN 13, creatinine 0.72, potassium 4.4, AST 32, ALT 25, cholesterol 185, triglycerides 54, HDL 55, LDL 119, TSH 3.18  Assessment and Plan:  1. Dyspnea on exertion and fatigue, also  intermittent precordial pain with overall atypical features. Patient's father died in his late 41s after several strokes suggesting premature atherosclerosis. She does not have any personal history of tobacco use, hypertension, or diabetes mellitus, takes omega-3 supplements for mild hyperlipidemia per chart. ECG does not show any acute ST segment changes. She has not undergone any ischemic testing and we will arrange an exercise echocardiogram for evaluation.  2. Reported mild hyperlipidemia, on omega-3 supplements. Recent triglycerides normal and LDL 119.  Current medicines were reviewed with the patient today.   Orders Placed This Encounter  Procedures  . ECHOCARDIOGRAM STRESS TEST    Disposition: Call with test results.  Signed, Satira Sark, MD, [facility] 12/13/2016 11:17 AM    [place] [facility] [address], [place], [place] [address] Phone: [phone]; Fax: [phone]

## 2016-12-13 ENCOUNTER — Encounter: Payer: Self-pay | Admitting: Cardiology

## 2016-12-13 ENCOUNTER — Ambulatory Visit (INDEPENDENT_AMBULATORY_CARE_PROVIDER_SITE_OTHER): Payer: BLUE CROSS/BLUE SHIELD | Admitting: Cardiology

## 2016-12-13 VITALS — BP 110/76 | HR 96 | Ht 68.0 in | Wt 147.0 lb

## 2016-12-13 DIAGNOSIS — E782 Mixed hyperlipidemia: Secondary | ICD-10-CM | POA: Diagnosis not present

## 2016-12-13 DIAGNOSIS — Z8249 Family history of ischemic heart disease and other diseases of the circulatory system: Secondary | ICD-10-CM

## 2016-12-13 DIAGNOSIS — R0600 Dyspnea, unspecified: Secondary | ICD-10-CM

## 2016-12-13 DIAGNOSIS — R0609 Other forms of dyspnea: Secondary | ICD-10-CM

## 2016-12-13 DIAGNOSIS — R0789 Other chest pain: Secondary | ICD-10-CM

## 2016-12-13 NOTE — Patient Instructions (Signed)
Medication Instructions:  Your physician recommends that you continue on your current medications as directed. Please refer to the Current Medication list given to you today.  Labwork: NONE  Testing/Procedures: Your physician has requested that you have a stress echocardiogram. For further information please visit [email or web address]. Please follow instruction sheet as given.  Follow-Up: Your physician recommends that you schedule a follow-up appointment PENDING TEST RESULTS   Any Other Special Instructions Will Be Listed Below (If Applicable).  If you need a refill on your cardiac medications before your next appointment, please call your pharmacy.

## 2016-12-19 ENCOUNTER — Ambulatory Visit (HOSPITAL_COMMUNITY)
Admission: RE | Admit: 2016-12-19 | Discharge: 2016-12-19 | Disposition: A | Discharge status: Home / Self Care | Payer: BLUE CROSS/BLUE SHIELD | Source: Ambulatory Visit | Attending: Cardiology | Admitting: Cardiology

## 2016-12-19 DIAGNOSIS — R0789 Other chest pain: Secondary | ICD-10-CM | POA: Diagnosis not present

## 2016-12-19 NOTE — Progress Notes (Signed)
*  PRELIMINARY RESULTS* Echocardiogram Echocardiogram Stress Test has been performed.  Christy Kelly 12/19/2016, 11:04 AM

## 2016-12-20 LAB — ECHOCARDIOGRAM STRESS TEST
Estimated workload: 10.9 METS
Exercise duration (min): 9 min
Exercise duration (sec): 23 s
MPHR: 157 {beats}/min
Peak HR: 166 {beats}/min
Percent HR: 105 %
RPE: 15
Rest HR: 85 {beats}/min

## 2018-08-12 LAB — HEMOGLOBIN A1C: Hemoglobin A1C: 5.5

## 2018-08-12 LAB — CBC AND DIFFERENTIAL: Platelets: 217 (ref 150–399)

## 2018-08-12 LAB — BASIC METABOLIC PANEL
BUN: 12 (ref 4–21)
Creatinine: 0.9 (ref 0.5–1.1)
Potassium: 4.2 (ref 3.4–5.3)
Sodium: 140 (ref 137–147)

## 2018-08-12 LAB — HEPATIC FUNCTION PANEL
ALT: 24 (ref 7–35)
AST: 23 (ref 13–35)
Bilirubin, Total: 0.7

## 2018-08-13 LAB — LIPID PANEL
Cholesterol: 219 — AB (ref 0–200)
HDL: 167 — AB (ref 35–70)
LDL Cholesterol: 147

## 2018-08-14 LAB — HM MAMMOGRAPHY

## 2018-08-14 LAB — HM PAP SMEAR

## 2018-08-15 LAB — HM DEXA SCAN

## 2019-02-13 ENCOUNTER — Other Ambulatory Visit: Payer: Self-pay

## 2019-02-13 ENCOUNTER — Ambulatory Visit: Payer: Medicare Other | Admitting: Family Medicine

## 2019-02-13 ENCOUNTER — Encounter: Payer: Self-pay | Admitting: Family Medicine

## 2019-02-13 VITALS — BP 120/84 | HR 93 | Temp 97.7°F | Resp 17 | Ht 68.75 in | Wt 161.4 lb

## 2019-02-13 DIAGNOSIS — Z7689 Persons encountering health services in other specified circumstances: Secondary | ICD-10-CM | POA: Diagnosis not present

## 2019-02-13 DIAGNOSIS — G629 Polyneuropathy, unspecified: Secondary | ICD-10-CM | POA: Diagnosis not present

## 2019-02-13 DIAGNOSIS — M5136 Other intervertebral disc degeneration, lumbar region: Secondary | ICD-10-CM | POA: Diagnosis not present

## 2019-02-13 NOTE — Progress Notes (Signed)
Patient ID: Christy Kelly, female  DOB: [birth year], 68 y.o.   MRN: [id number] Patient Care Team    Relationship Specialty Notifications Start End  Ma Hillock, DO PCP - General Family Medicine  02/13/19   Dian Queen, MD Consulting Physician Obstetrics and Gynecology  02/16/19     Chief Complaint  Patient presents with  . Establish Care    When pt lays down at night she feels burning in both feet x1 yr. Pap smears- 2019 [facility], Mammogram 2019. Dr Edrick Oh [facility].     Subjective:  Christy Kelly is a 68 y.o.  female present for new patient establishment. All past medical history, surgical history, allergies, family history, immunizations, medications and social history were updated in the electronic medical record today. All recent labs, ED visits and hospitalizations within the last year were reviewed.  Neuropathy/DDD lumbar spine: Patient reports for the past 1 to 2 years whenever she lays flat at night her bilateral feet start to burn.  She states that it started in 1 foot and has slowly progressed to bilateral condition and now affecting all of her toes.  She denies any bladder or bowel dysfunction.  She reports a sledding accident in 2015.  She states she was cleared at that time without any acute injury.  She was having some discomfort a few years after and reports seeing a neurologist and having an MRI  of her spine completed. She denies any daytime symptoms.  She denies any numbness.  She denies any tripping or falling secondary to weakness in her lower extremities.  Depression screen PHQ 2/9 02/13/2019  Decreased Interest 0  Down, Depressed, Hopeless 0  PHQ - 2 Score 0   No flowsheet data found.   No flowsheet data found.  Immunization History  Administered Date(s) Administered  . Pneumococcal Polysaccharide-23 08/23/2018  . Tdap 08/31/2006  . Zoster Recombinat (Shingrix) 08/23/2018, 01/23/2019    No exam data present   Past Medical History:  Diagnosis Date  . Allergy   . Chicken pox   . Hyperlipidemia   . Menopausal state   . 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 Primary exertional headache 09/05/2015  . Scoliosis    Allergies  Allergen Reactions  . Codeine Itching and Nausea And Vomiting   Past Surgical History:  Procedure Laterality Date  . BUNIONECTOMY     x2 about 2012 bilateral.   . HYSTEROSCOPY  2001  . TONSILLECTOMY  1962  . WISDOM TOOTH EXTRACTION     Family History  Problem Relation Age of Onset  . Stroke Father   . Early death Father   . Hypertension Brother   . Hypercholesterolemia Brother   . Alcohol abuse Brother   . Cancer Paternal Grandmother   . Breast cancer Paternal Grandmother   . Cancer Paternal Grandfather   . Colon cancer Neg Hx   . Esophageal cancer Neg Hx   . Rectal cancer Neg Hx   . Stomach cancer Neg Hx    Social History   Social History Narrative   Marital status/children/pets: married, lives with husband.    Education/employment: 1 year of college-retired.  Worked as a Information systems manager.   Safety:      -smoke alarm in the home:Yes     - wears seatbelt: Yes     - Feels safe in their relationships: Yes          Allergies as of 02/13/2019  Reactions   Codeine Itching, Nausea And Vomiting      Medication List       Accurate as of February 13, 2019 11:59 PM. If you have any questions, ask your nurse or doctor.        aspirin EC 81 MG tablet Take 81 mg by mouth daily.   Biotin 2500 MCG Caps Take 2 capsules by mouth daily.   CALCIUM PO Take 1 tablet by mouth daily.   CoQ10 100 MG Caps Take 1 capsule by mouth daily.   estradiol 0.1 MG/GM vaginal cream Commonly known as: ESTRACE Place 1 Applicatorful vaginally once a week.   Fish Oil 1000 MG Caps Take 1,000 mg by mouth daily.   MAGNESIUM PO Take 500 mg by mouth daily.   OVER THE COUNTER MEDICATION Take 2 tablets by mouth daily.   vitamin B-12 250 MCG tablet Commonly known as:  CYANOCOBALAMIN Take 250 mcg by mouth daily.   Vitamin D 50 MCG (2000 UT) Caps Take 1 capsule by mouth daily.   vitamin E 400 UNIT capsule Take 400 Units by mouth daily.   ZYRTEC PO Take by mouth.       All past medical history, surgical history, allergies, family history, immunizations andmedications were updated in the EMR today and reviewed under the history and medication portions of their EMR.    No results found for this or any previous visit (from the past 2160 hour(s)).  No results found.   ROS: 14 pt review of systems performed and negative (unless mentioned in an HPI)  Objective: BP 120/84 (BP Location: Left Arm, Patient Position: Sitting, Cuff Size: Normal)   Pulse 93   Temp 97.7 F (36.5 C) (Temporal)   Resp 17   Ht 5' 8.75" (1.746 m)   Wt 161 lb 6 oz (73.2 kg)   SpO2 96%   BMI 24.00 kg/m  Gen: Afebrile. No acute distress. Nontoxic in appearance, well-developed, well-nourished, pleasant Caucasian female. HENT: AT. .  MMM Eyes:Pupils Equal Round Reactive to light, Extraocular movements intact,  Conjunctiva without redness, discharge or icterus. Neck/lymp/endocrine: Supple, no lymphadenopathy, no thyromegaly CV: RRR no murmur, no edema Chest: CTAB, no wheeze, rhonchi or crackles. Skin: No rashes, purpura or petechiae. Warm and well-perfused. Skin intact. Neuro/Msk:  Normal gait. PERLA. EOMi. Alert. Oriented x3.  Cranial nerves II through XII intact. Muscle strength 5/5 upper/lower extremity. DTRs equal bilaterally.  Very mild decrease in sensation bilateral toes. No lumbar bone tenderness or step off.  Psych: Normal affect, dress and demeanor. Normal speech. Normal thought content and judgment.   Assessment/plan: Christy Kelly is a 68 y.o. female present for establish care with complaints of worsening neuropathy of bilateral feet. Neuropathy - xray lumbar spine. Suspect this is sequela of degenerative changes after her sledding accident 5 years ago.  Discussed lab work up, medications, referrals and image today. She wants to wait on labs (since recently collected at GYN) and does not desire meds at this time.  - Await on GYN labs results before proceeding with additional labs. Would liek her to have B12, Vit d, a1c, iron  and thyroid panel (and she thinks she has had these at GYN). F/U will be discussed once records received to inform when to schedule her CPE. If no records are received CPE in 3 mos.    Greater than 30 minutes spent with patient, >50% of time spent face to face   Note is dictated utilizing voice recognition software. Although note has been  proof read prior to signing, occasional typographical errors still can be missed. If any questions arise, please do not hesitate to call for verification.  Electronically signed by: Howard Pouch, DO [place]

## 2019-02-13 NOTE — Patient Instructions (Signed)
We will await your records- once we get those labs we will call you to move forward on test for your numbness.  I do feel you need to have an xray of your back. They will call you to schedule.   We will call you to schedule your physical after we get your records.   Spinal Stenosis  Spinal stenosis occurs when the open space (spinal canal) between the bones of your spine (vertebrae) narrows, putting pressure on the spinal cord or nerves. What are the causes? This condition is caused by areas of bone pushing into the central canals of your vertebrae. This condition may be present at birth (congenital), or it may be caused by:  Arthritic deterioration of your vertebrae (spinal degeneration). This usually starts around age 42.  Injury or trauma to the spine.  Tumors in the spine.  Calcium deposits in the spine. What are the signs or symptoms? Symptoms of this condition include:  Pain in the neck or back that is generally worse with activities, particularly when standing and walking.  Numbness, tingling, hot or cold sensations, weakness, or weariness in your legs.  Pain going up and down the leg (sciatica).  Frequent episodes of falling.  A foot-slapping gait that leads to muscle weakness. In more serious cases, you may develop:  Problemspassing stool or passing urine.  Difficulty having sex.  Loss of feeling in part or all of your leg. Symptoms may come on slowly and get worse over time. How is this diagnosed? This condition is diagnosed based on your medical history and a physical exam. Tests will also be done, such as:  MRI.  CT scan.  X-ray. How is this treated? Treatment for this condition often focuses on managing your pain and any other symptoms. Treatment may include:  Practicing good posture to lessen pressure on your nerves.  Exercising to strengthen muscles, build endurance, improve balance, and maintain good joint movement (range of motion).  Losing  weight, if needed.  Taking medicines to reduce swelling, inflammation, or pain.  Assistive devices, such as a corset or brace. In some cases, surgery may be needed. The most common procedure is decompression laminectomy. This is done to remove excess bone that puts pressure on your nerve roots. Follow these instructions at home: Managing pain, stiffness, and swelling  Do all exercises and stretches as told by your health care provider.  Practice good posture. If you were given a brace or a corset, wear it as told by your health care provider.  Do not do any activities that cause pain. Ask your health care provider what activities are safe for you.  Do not lift anything that is heavier than 10 lb (4.5 kg) or the limit that your health care provider tells you.  Maintain a healthy weight. Talk with your health care provider if you need help losing weight.  If directed, apply heat to the affected area as often as told by your health care provider. Use the heat source that your health care provider recommends, such as a moist heat pack or a heating pad. ? Place a towel between your skin and the heat source. ? Leave the heat on for 20-30 minutes. ? Remove the heat if your skin turns bright red. This is especially important if you are not able to feel pain, heat, or cold. You may have a greater risk of getting burned. General instructions  Take over-the-counter and prescription medicines only as told by your health care provider.  Do  not use any products that contain nicotine or tobacco, such as cigarettes and e-cigarettes. If you need help quitting, ask your health care provider.  Eat a healthy diet. This includes plenty of fruits and vegetables, whole grains, and low-fat (lean) protein.  Keep all follow-up visits as told by your health care provider. This is important. Contact a health care provider if:  Your symptoms do not get better or they get worse.  You have a fever. Get help  right away if:  You have new or worse pain in your neck or upper back.  You have severe pain that cannot be controlled with medicines.  You are dizzy.  You have vision problems, blurred vision, or double vision.  You have a severe headache that is worse when you stand.  You have nausea or you vomit.  You develop new or worse numbness or tingling in your back or legs.  You have pain, redness, swelling, or warmth in your arm or leg. Summary  Spinal stenosis occurs when the open space (spinal canal) between the bones of your spine (vertebrae) narrows. This narrowing puts pressure on the spinal cord or nerves.  Spinal stenosis can cause numbness, weakness, or pain in the neck, back, and legs.  This condition may be caused by a birth defect, arthritic deterioration of your vertebrae, injury, tumors, or calcium deposits.  This condition is usually diagnosed with MRIs, CT scans, and X-rays. This information is not intended to replace advice given to you by your health care provider. Make sure you discuss any questions you have with your health care provider. Document Released: 10/13/2003 Document Revised: 07/05/2017 Document Reviewed: 06/27/2016 Elsevier Patient Education  2020 [employer].    Please help [place] help you:  We are honored you have chosen [facility] for your Primary Care home. Below you will find basic instructions that you may need to access in the future. Please help [place] help you by reading the instructions, which cover many of the frequent questions we experience.   Prescription refills and request:  -In order to allow more efficient response time, please call your pharmacy for all refills. They will forward the request electronically to [place]. This allows for the quickest possible response. Request left on a nurse line can take longer to refill, since these are checked as time allows between office patients and other phone calls.  - refill request can take up to 3-5  working days to complete.  - If request is sent electronically and request is appropiate, it is usually completed in 1-2 business days.  - all patients will need to be seen routinely for all chronic medical conditions requiring prescription medications (see follow-up below). If you are overdue for follow up on your condition, you will be asked to make an appointment and we will call in enough medication to cover you until your appointment (up to 30 days).  - all controlled substances will require a face to face visit to request/refill.  - if you desire your prescriptions to go through a new pharmacy, and have an active script at original pharmacy, you will need to call your pharmacy and have scripts transferred to new pharmacy. This is completed between the pharmacy locations and not by your provider.    Results: If any images or labs were ordered, it can take up to 1 week to get results depending on the test ordered and the lab/facility running and resulting the test. - Normal or stable results, which do not  need further discussion, may be released to your mychart immediately with attached note to you. A call may not be generated for normal results. Please make certain to sign up for mychart. If you have questions on how to activate your mychart you can call the front office.  - If your results need further discussion, our office will attempt to contact you via phone, and if unable to reach you after 2 attempts, we will release your abnormal result to your mychart with instructions.  - All results will be automatically released in mychart after 1 week.  - Your provider will provide you with explanation and instruction on all relevant material in your results. Please keep in mind, results and labs may appear confusing or abnormal to the untrained eye, but it does not mean they are actually abnormal for you personally. If you have any questions about your results that are not covered, or you desire more  detailed explanation than what was provided, you should make an appointment with your provider to do so.   Our office handles many outgoing and incoming calls daily. If we have not contacted you within 1 week about your results, please check your mychart to see if there is a message first and if not, then contact our office.  In helping with this matter, you help decrease call volume, and therefore allow [place] to be able to respond to patients needs more efficiently.   Acute office visits (sick visit):  An acute visit is intended for a new problem and are scheduled in shorter time slots to allow schedule openings for patients with new problems. This is the appropriate visit to discuss a new problem. Problems will not be addressed by phone call or Echart message. Appointment is needed if requesting treatment. In order to provide you with excellent quality medical care with proper time for you to explain your problem, have an exam and receive treatment with instructions, these appointments should be limited to one new problem per visit. If you experience a new problem, in which you desire to be addressed, please make an acute office visit, we save openings on the schedule to accommodate you. Please do not save your new problem for any other type of visit, let us take care of it properly and quickly for you.   Follow up visits:  Depending on your condition(s) your provider will need to see you routinely in order to provide you with quality care and prescribe medication(s). Most chronic conditions (Example: hypertension, Diabetes, depression/anxiety... etc), require visits a couple times a year. Your provider will instruct you on proper follow up for your personal medical conditions and history. Please make certain to make follow up appointments for your condition as instructed. Failing to do so could result in lapse in your medication treatment/refills. If you request a refill, and are overdue to be seen on a  condition, we will always provide you with a 30 day script (once) to allow you time to schedule.    Medicare wellness (well visit): - we have a wonderful Nurse Maudie Mercury), that will meet with you and provide you will yearly medicare wellness visits. These visits should occur yearly (can not be scheduled less than 1 calendar year apart) and cover preventive health, immunizations, advance directives and screenings you are entitled to yearly through your medicare benefits. Do not miss out on your entitled benefits, this is when medicare will pay for these benefits to be ordered for you.  These are strongly encouraged by  your provider and is the appropriate type of visit to make certain you are up to date with all preventive health benefits. If you have not had your medicare wellness exam in the last 12 months, please make certain to schedule one by calling the office and schedule your medicare wellness with Maudie Mercury as soon as possible.   Yearly physical (well visit):  - Adults are recommended to be seen yearly for physicals. Check with your insurance and date of your last physical, most insurances require one calendar year between physicals. Physicals include all preventive health topics, screenings, medical exam and labs that are appropriate for gender/age and history. You may have fasting labs needed at this visit. This is a well visit (not a sick visit), new problems should not be covered during this visit (see acute visit).  - Pediatric patients are seen more frequently when they are younger. Your provider will advise you on well child visit timing that is appropriate for your their age. - This is not a medicare wellness visit. Medicare wellness exams do not have an exam portion to the visit. Some medicare companies allow for a physical, some do not allow a yearly physical. If your medicare allows a yearly physical you can schedule the medicare wellness with our nurse Maudie Mercury and have your physical with your provider  after, on the same day. Please check with insurance for your full benefits.   Late Policy/No Shows:  - all new patients should arrive 15-30 minutes earlier than appointment to allow [place] time  to  obtain all personal demographics,  insurance information and for you to complete office paperwork. - All established patients should arrive 10-15 minutes earlier than appointment time to update all information and be checked in .  - In our best efforts to run on time, if you are late for your appointment you will be asked to either reschedule or if able, we will work you back into the schedule. There will be a wait time to work you back in the schedule,  depending on availability.  - If you are unable to make it to your appointment as scheduled, please call 24 hours ahead of time to allow [place] to fill the time slot with someone else who needs to be seen. If you do not cancel your appointment ahead of time, you may be charged a no show fee.

## 2019-02-16 ENCOUNTER — Encounter: Payer: Self-pay | Admitting: Family Medicine

## 2019-02-20 ENCOUNTER — Encounter: Payer: Self-pay | Admitting: Family Medicine

## 2019-02-20 ENCOUNTER — Telehealth: Payer: Self-pay | Admitting: Family Medicine

## 2019-02-20 ENCOUNTER — Ambulatory Visit
Admission: RE | Admit: 2019-02-20 | Discharge: 2019-02-20 | Disposition: A | Discharge status: Home / Self Care | Payer: Medicare Other | Source: Ambulatory Visit | Attending: Family Medicine | Admitting: Family Medicine

## 2019-02-20 DIAGNOSIS — M47816 Spondylosis without myelopathy or radiculopathy, lumbar region: Secondary | ICD-10-CM | POA: Diagnosis not present

## 2019-02-20 DIAGNOSIS — G629 Polyneuropathy, unspecified: Secondary | ICD-10-CM

## 2019-02-20 DIAGNOSIS — M5136 Other intervertebral disc degeneration, lumbar region: Secondary | ICD-10-CM

## 2019-02-20 NOTE — Telephone Encounter (Signed)
Please inform patient the following information: 1.  Her lumbar x-ray did show evidence of degenerative changes and bone spur formation at L3-4 level and L4-5 level.  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 -The nerves that are responsible for the area she is having symptoms would be the L4, L5 and S1 levels-which are at the bottom 2 lumbar vertebrae and the sacrum which is the triangle bone at the end of the spinal column.  -  In summation the levels where there are changes on her lumbar spine are the levels where she is having her symptoms.  -We have not received her records yet from gynecology.  We were waiting on those to guide her if we need to collect lab work here or not, to further work-up this condition.  2. She had a very large stool burden in her colon. I would suggest she start daily miralax 1 cap in 8 ounces of water daily- continue using daily unless she experiences very loose stool (the taper back to 1/2 cap if this occurs). This safe and just pulls water into her colon... so she does have to make sure she is drinking 60-80 ounces of water daily (which is normal water intake).    - still waiting on other records on her also to guide her when to schedule CPE. If she does not hear from [place] in the next 2 weeks- I would suggest she call in and schedule her CPE in 3 months (please tell her NOT to take her biotin for 2 weeks prior to labs- can cause thyroid panel to be inaccurate)

## 2019-02-23 NOTE — Telephone Encounter (Signed)
Pt returned call 7/20 pls return call at [phone]

## 2019-02-23 NOTE — Telephone Encounter (Signed)
LM for patient to call for results.

## 2019-02-24 NOTE — Telephone Encounter (Signed)
Spoke with patient to give all information below.  Stated verbal understanding.  She is aware to call if she has not heard from [place] in 2 weeks and she will schedule a CPE in 3 months. She is aware to hold biotin 2 weeks prior to that appointment.

## 2019-03-20 NOTE — Telephone Encounter (Signed)
Patient called back as advised. She has stopped taking the biotin. When I asked her to schedule her CPE in 3 months she said that she understood she was supposed to be seen sooner. Patient scheduled for 04/07/19. Advised patient to check with her insurance to see if 04/07/19 is okay.

## 2019-03-24 ENCOUNTER — Encounter: Payer: Self-pay | Admitting: Family Medicine

## 2019-04-07 ENCOUNTER — Other Ambulatory Visit: Payer: Self-pay

## 2019-04-07 ENCOUNTER — Ambulatory Visit (INDEPENDENT_AMBULATORY_CARE_PROVIDER_SITE_OTHER): Payer: Medicare Other | Admitting: Family Medicine

## 2019-04-07 ENCOUNTER — Encounter: Payer: Self-pay | Admitting: Family Medicine

## 2019-04-07 VITALS — BP 114/77 | HR 83 | Temp 98.3°F | Resp 19 | Ht 68.25 in | Wt 156.2 lb

## 2019-04-07 DIAGNOSIS — M5136 Other intervertebral disc degeneration, lumbar region: Secondary | ICD-10-CM | POA: Diagnosis not present

## 2019-04-07 DIAGNOSIS — Z1159 Encounter for screening for other viral diseases: Secondary | ICD-10-CM

## 2019-04-07 DIAGNOSIS — Z23 Encounter for immunization: Secondary | ICD-10-CM

## 2019-04-07 DIAGNOSIS — M2550 Pain in unspecified joint: Secondary | ICD-10-CM | POA: Insufficient documentation

## 2019-04-07 DIAGNOSIS — Z0000 Encounter for general adult medical examination without abnormal findings: Secondary | ICD-10-CM

## 2019-04-07 DIAGNOSIS — G629 Polyneuropathy, unspecified: Secondary | ICD-10-CM

## 2019-04-07 DIAGNOSIS — M791 Myalgia, unspecified site: Secondary | ICD-10-CM

## 2019-04-07 LAB — COMPREHENSIVE METABOLIC PANEL
ALT: 21 U/L (ref 0–35)
AST: 22 U/L (ref 0–37)
Albumin: 4.2 g/dL (ref 3.5–5.2)
Alkaline Phosphatase: 78 U/L (ref 39–117)
BUN: 18 mg/dL (ref 6–23)
CO2: 26 mEq/L (ref 19–32)
Calcium: 9.2 mg/dL (ref 8.4–10.5)
Chloride: 106 mEq/L (ref 96–112)
Creatinine, Ser: 0.83 mg/dL (ref 0.40–1.20)
GFR: 68.77 mL/min (ref >60.00)
Glucose, Bld: 87 mg/dL (ref 70–99)
Potassium: 4.2 mEq/L (ref 3.5–5.1)
Sodium: 139 mEq/L (ref 135–145)
Total Bilirubin: 0.6 mg/dL (ref 0.2–1.2)
Total Protein: 7 g/dL (ref 6.0–8.3)

## 2019-04-07 LAB — VITAMIN B12: Vitamin B-12: 837 pg/mL (ref 211–911)

## 2019-04-07 LAB — TSH: TSH: 1.34 u[IU]/mL (ref 0.35–4.50)

## 2019-04-07 LAB — T4, FREE: Free T4: 0.82 ng/dL (ref 0.60–1.60)

## 2019-04-07 LAB — VITAMIN D 25 HYDROXY (VIT D DEFICIENCY, FRACTURES): VITD: 72.83 ng/mL (ref 30.00–100.00)

## 2019-04-07 MED ORDER — TETANUS-DIPHTH-ACELL PERTUSSIS 5-2.5-18.5 LF-MCG/0.5 IM SUSP: 0.5000 mL | Freq: Once | INTRAMUSCULAR | 0 refills | Status: AC

## 2019-04-07 NOTE — Progress Notes (Signed)
Patient ID: Christy Kelly, female  DOB: 1951/05/04, 68 y.o.   MRN: [id number] Patient Care Team    Relationship Specialty Notifications Start End  Ma Hillock, DO PCP - General Family Medicine  02/13/19   Dian Queen, MD Consulting Physician Obstetrics and Gynecology  02/16/19   Satira Sark, MD Consulting Physician Cardiology  04/07/19   Penni Bombard, MD Consulting Physician Neurology  04/07/19     Chief Complaint  Patient presents with  . Annual Exam    Fasting. No Concerns. Mammo and pap performed in 08/2018.     Subjective:  Christy Kelly is a 68 y.o.  Female  present for CPE. All past medical history, surgical history, allergies, family history, immunizations, medications and social history were updated in the electronic medical record today. All recent labs, ED visits and hospitalizations within the last year were reviewed.   Arthralgia, unspecified joint/myalgia/DDD, Lumbar spine Patient reports her lower back discomfort has somewhat improved but still present.  Discomfort with laying flat with her feet burning.  Reviewed labs collected line in January. Prior note:  Patient reports for the past 1 to 2 years whenever she lays flat at night her bilateral feet start to burn.  She states that it started in 1 foot and has slowly progressed to bilateral condition and now affecting all of her toes.  She denies any bladder or bowel dysfunction.  She reports a sledding accident in 2015.  She states she was cleared at that time without any acute injury.  She was having some discomfort a few years after and reports seeing a neurologist and having an MRI  of her spine completed. She denies any daytime symptoms.  She denies any numbness.  She denies any tripping or falling secondary to weakness in her lower extremities.  Health maintenance:  Colonoscopy: completed 01/2014, by Dr. Olevia Perches. follow up 10 yr. Mammogram: completed:08/2018, competed at gyn.  Cervical cancer  screening: > 65 followed by gyn Immunizations: tdap 2008- prescribed, Influenza administered today (encouraged yearly), PNA23 completed 08/2018, shingrix series completed 01/2019 Infectious disease screening:  Hep C agreeable to test today.  DEXA: last completed 08/2018, result -1.3, follow by GYN Assistive device: none Oxygen [id number] Patient has a Dental home. Hospitalizations/ED visits: reviewed  Depression screen [facility] 2/9 04/07/2019 02/13/2019  Decreased Interest 0 0  Down, Depressed, Hopeless 0 0  PHQ - 2 Score 0 0   No flowsheet data found.  Immunization History  Administered Date(s) Administered  . Fluad Quad(high Dose 65+) 04/07/2019  . Pneumococcal Polysaccharide-23 08/23/2018  . Tdap 08/31/2006  . Zoster Recombinat (Shingrix) 08/23/2018, 01/23/2019   Past Medical History:  Diagnosis Date  . Allergy   . Chicken pox   . Hyperlipidemia   . Menopausal state   . 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 Primary exertional headache 09/05/2015  . Scoliosis    Minimal levoconvex lumbar rotary scoliosis.   Allergies  Allergen Reactions  . Codeine Itching and Nausea And Vomiting   Past Surgical History:  Procedure Laterality Date  . BUNIONECTOMY     x2 about 2012 bilateral.   . HYSTEROSCOPY  2001  . TONSILLECTOMY  1962  . WISDOM TOOTH EXTRACTION     Family History  Problem Relation Age of Onset  . Stroke Father   . Early death Father   . Hypertension Brother   . Hypercholesterolemia Brother   . Alcohol abuse Brother   . Breast cancer Paternal Grandmother   .  Cancer Paternal Grandfather        Uncertain type- Li fraumeni gene  . Colon cancer Neg Hx   . Esophageal cancer Neg Hx   . Rectal cancer Neg Hx   . Stomach cancer Neg Hx    Social History   Social History Narrative   Marital status/children/pets: married, lives with husband.    Education/employment: 1 year of college-retired.  Worked as a Information systems manager.   Safety:      -smoke alarm in the home:Yes     -  wears seatbelt: Yes     - Feels safe in their relationships: Yes          Allergies as of 04/07/2019      Reactions   Codeine Itching, Nausea And Vomiting      Medication List       Accurate as of April 07, 2019  5:25 PM. If you have any questions, ask your nurse or doctor.        STOP taking these medications   aspirin EC 81 MG tablet Stopped by: Howard Pouch, DO   Biotin 2500 MCG Caps Stopped by: Howard Pouch, DO   OVER THE COUNTER MEDICATION Stopped by: Howard Pouch, DO     TAKE these medications   CALCIUM PO Take 1 tablet by mouth daily.   CoQ10 100 MG Caps Take 1 capsule by mouth daily.   estradiol 0.1 MG/GM vaginal cream Commonly known as: ESTRACE Place 1 Applicatorful vaginally once a week.   Fish Oil 1000 MG Caps Take 1,000 mg by mouth daily.   MAGNESIUM PO Take 500 mg by mouth daily.   Tdap 5-2.5-18.5 LF-MCG/0.5 injection Commonly known as: BOOSTRIX Inject 0.5 mLs into the muscle once for 1 dose. Started by: Howard Pouch, DO   vitamin B-12 250 MCG tablet Commonly known as: CYANOCOBALAMIN Take 250 mcg by mouth daily.   Vitamin D 50 MCG (2000 UT) Caps Take 1 capsule by mouth daily.   vitamin E 400 UNIT capsule Take 400 Units by mouth daily.   ZYRTEC PO Take by mouth.       All past medical history, surgical history, allergies, family history, immunizations andmedications were updated in the EMR today and reviewed under the history and medication portions of their EMR.     Recent Results (from the past 2160 hour(s))  Comprehensive metabolic panel     Status: None   Collection Time: 04/07/19 11:28 AM  Result Value Ref Range   Sodium 139 135 - 145 mEq/L   Potassium 4.2 3.5 - 5.1 mEq/L   Chloride 106 96 - 112 mEq/L   CO2 26 19 - 32 mEq/L   Glucose, Bld 87 70 - 99 mg/dL   BUN 18 6 - 23 mg/dL   Creatinine, Ser 0.83 0.40 - 1.20 mg/dL   Total Bilirubin 0.6 0.2 - 1.2 mg/dL   Alkaline Phosphatase 78 39 - 117 U/L   AST 22 0 - 37 U/L    ALT 21 0 - 35 U/L   Total Protein 7.0 6.0 - 8.3 g/dL   Albumin 4.2 3.5 - 5.2 g/dL   Calcium 9.2 8.4 - 10.5 mg/dL   GFR 68.77 >60.00 mL/min  TSH     Status: None   Collection Time: 04/07/19 11:28 AM  Result Value Ref Range   TSH 1.34 0.35 - 4.50 uIU/mL  T4, free     Status: None   Collection Time: 04/07/19 11:28 AM  Result Value Ref Range  Free T4 0.82 0.60 - 1.60 ng/dL    Comment: Specimens from patients who are undergoing biotin therapy and /or ingesting biotin supplements may contain high levels of biotin.  The higher biotin concentration in these specimens interferes with this Free T4 assay.  Specimens that contain high levels  of biotin may cause false high results for this Free T4 assay.  Please interpret results in light of the total clinical presentation of the patient.    B12     Status: None   Collection Time: 04/07/19 11:28 AM  Result Value Ref Range   Vitamin B-12 837 211 - 911 pg/mL  Vitamin D (25 hydroxy)     Status: None   Collection Time: 04/07/19 11:28 AM  Result Value Ref Range   VITD 72.83 30.00 - 100.00 ng/mL    Dg Lumbar Spine Complete  Result Date: 02/20/2019 CLINICAL DATA:  Chronic low back pain following an injury 5 years ago. Bilateral radiating leg pain and numbness in the left toes. EXAM: LUMBAR SPINE - COMPLETE 4+ VIEW COMPARISON:  09/23/2013. FINDINGS: Five non-rib-bearing lumbar vertebrae. Stable minimal levoconvex lumbar rotary scoliosis. Stable mild anterior spur formation at the L3-4 level and minimal anterior spur formation at the L4-5 level. Facet degenerative changes throughout the lumbar spine, most pronounced at the L5-S1 level. No fractures, pars defects or subluxations. Prominent stool throughout the colon. IMPRESSION: 1. Degenerative changes, as described above. 2. Prominent stool throughout the colon. Electronically Signed   By: Claudie Revering M.D.   On: 02/20/2019 14:51    ROS: 14 pt review of systems performed and negative (unless mentioned in  an HPI)  Objective: BP 114/77 (BP Location: Left Arm, Patient Position: Sitting, Cuff Size: Normal)   Pulse 83   Temp 98.3 F (36.8 C) (Temporal)   Resp 19   Ht 5' 8.25" (1.734 m)   Wt 156 lb 4 oz (70.9 kg)   SpO2 99%   BMI 23.58 kg/m  Gen: Afebrile. No acute distress. Nontoxic in appearance, well-developed, well-nourished, pleasant Caucasian female. HENT: AT. [place]. Bilateral TM visualized and normal in appearance, normal external auditory canal. MMM, no oral lesions, adequate dentition. Bilateral nares within normal limits. Throat without erythema, ulcerations or exudates.  No cough on exam, no hoarseness on exam. Eyes:Pupils Equal Round Reactive to light, Extraocular movements intact,  Conjunctiva without redness, discharge or icterus. Neck/lymp/endocrine: Supple, no lymphadenopathy, no thyromegaly CV: RRR no murmur, no edema, +2/4 P posterior tibialis pulses.  No carotid bruits. No JVD. Chest: CTAB, no wheeze, rhonchi or crackles.  Normal respiratory effort.  Good air movement. Abd: Soft.  Flat. NTND. BS present.  No masses palpated. No hepatosplenomegaly. No rebound tenderness or guarding. Skin: No rashes, purpura or petechiae. Warm and well-perfused. Skin intact. Neuro/Msk:  Normal gait. PERLA. EOMi. Alert. Oriented x3.  Cranial nerves II through XII intact. Muscle strength 5/5 upper/lower extremity. DTRs equal bilaterally. Psych: Normal affect, dress and demeanor. Normal speech. Normal thought content and judgment.   No exam data present  Assessment/plan: Christy Kelly is a 68 y.o. female present for CPE Neuropathy/arthralgia/myalgia/DDD, lumbar - xray lumbar spine with degenerative changes and bone spurring.  Suspect her symptoms most likely coming from radiculopathy of her lumbar spine when laying flat-however cannot rule out other potential causes for her paresthesia.  It has improved mildly since she has started walking.  We will proceed with labs to rule out other causes of  her paresthesia and discomfort with thyroid panel, B12/vitamin D and ANA. -  TSH - T4, free - B12 - Vitamin D (25 hydroxy) - ANA, IFA Comprehensive Panel-([facility]) Need for immunization against influenza - Flu Vaccine QUAD High Dose(Fluad) Need for hepatitis C screening test - Hepatitis C Antibody  Encounter for preventive health examination Patient was encouraged to exercise greater than 150 minutes a week. Patient was encouraged to choose a diet filled with fresh fruits and vegetables, and lean meats. AVS provided to patient today for education/recommendation on gender specific health and safety maintenance. Colonoscopy: completed 01/2014, by Dr. Olevia Perches. follow up 10 yr. Mammogram: completed:08/2018, competed at gyn.  Cervical cancer screening: > 65 followed by gyn Immunizations: tdap 2008- prescribed, Influenza administered today (encouraged yearly), PNA23 completed 08/2018, shingrix series completed 01/2019 Infectious disease screening:  Hep C agreeable to test today.  DEXA: last completed 08/2018, result -1.3, follow by GYN  Return in about 1 year (around 04/06/2020) for CPE (30 min).  Electronically signed by: Howard Pouch, DO [place]

## 2019-04-07 NOTE — Patient Instructions (Signed)
Health Maintenance, Female Adopting a healthy lifestyle and getting preventive care are important in promoting health and wellness. Ask your health care provider about:  The right schedule for you to have regular tests and exams.  Things you can do on your own to prevent diseases and keep yourself healthy. What should I know about diet, weight, and exercise? Eat a healthy diet   Eat a diet that includes plenty of vegetables, fruits, low-fat dairy products, and lean protein.  Do not eat a lot of foods that are high in solid fats, added sugars, or sodium. Maintain a healthy weight Body mass index (BMI) is used to identify weight problems. It estimates body fat based on height and weight. Your health care provider can help determine your BMI and help you achieve or maintain a healthy weight. Get regular exercise Get regular exercise. This is one of the most important things you can do for your health. Most adults should:  Exercise for at least 150 minutes each week. The exercise should increase your heart rate and make you sweat (moderate-intensity exercise).  Do strengthening exercises at least twice a week. This is in addition to the moderate-intensity exercise.  Spend less time sitting. Even light physical activity can be beneficial. Watch cholesterol and blood lipids Have your blood tested for lipids and cholesterol at 66 years of age, then have this test every 5 years. Have your cholesterol levels checked more often if:  Your lipid or cholesterol levels are high.  You are older than 66 years of age.  You are at high risk for heart disease. What should I know about cancer screening? Depending on your health history and family history, you may need to have cancer screening at various ages. This may include screening for:  Breast cancer.  Cervical cancer.  Colorectal cancer.  Skin cancer.  Lung cancer. What should I know about heart disease, diabetes, and high blood  pressure? Blood pressure and heart disease  High blood pressure causes heart disease and increases the risk of stroke. This is more likely to develop in people who have high blood pressure readings, are of African descent, or are overweight.  Have your blood pressure checked: ? Every 3-5 years if you are 18-39 years of age. ? Every year if you are 40 years old or older. Diabetes Have regular diabetes screenings. This checks your fasting blood sugar level. Have the screening done:  Once every three years after age 40 if you are at a normal weight and have a low risk for diabetes.  More often and at a younger age if you are overweight or have a high risk for diabetes. What should I know about preventing infection? Hepatitis B If you have a higher risk for hepatitis B, you should be screened for this virus. Talk with your health care provider to find out if you are at risk for hepatitis B infection. Hepatitis C Testing is recommended for:  Everyone born from 1945 through 1965.  Anyone with known risk factors for hepatitis C. Sexually transmitted infections (STIs)  Get screened for STIs, including gonorrhea and chlamydia, if: ? You are sexually active and are younger than 66 years of age. ? You are older than 66 years of age and your health care provider tells you that you are at risk for this type of infection. ? Your sexual activity has changed since you were last screened, and you are at increased risk for chlamydia or gonorrhea. Ask your health care provider if   you are at risk.  Ask your health care provider about whether you are at high risk for HIV. Your health care provider may recommend a prescription medicine to help prevent HIV infection. If you choose to take medicine to prevent HIV, you should first get tested for HIV. You should then be tested every 3 months for as long as you are taking the medicine. Pregnancy  If you are about to stop having your period (premenopausal) and  you may become pregnant, seek counseling before you get pregnant.  Take 400 to 800 micrograms (mcg) of folic acid every day if you become pregnant.  Ask for birth control (contraception) if you want to prevent pregnancy. Osteoporosis and menopause Osteoporosis is a disease in which the bones lose minerals and strength with aging. This can result in bone fractures. If you are 65 years old or older, or if you are at risk for osteoporosis and fractures, ask your health care provider if you should:  Be screened for bone loss.  Take a calcium or vitamin D supplement to lower your risk of fractures.  Be given hormone replacement therapy (HRT) to treat symptoms of menopause. Follow these instructions at home: Lifestyle  Do not use any products that contain nicotine or tobacco, such as cigarettes, e-cigarettes, and chewing tobacco. If you need help quitting, ask your health care provider.  Do not use street drugs.  Do not share needles.  Ask your health care provider for help if you need support or information about quitting drugs. Alcohol use  Do not drink alcohol if: ? Your health care provider tells you not to drink. ? You are pregnant, may be pregnant, or are planning to become pregnant.  If you drink alcohol: ? Limit how much you use to 0-1 drink a day. ? Limit intake if you are breastfeeding.  Be aware of how much alcohol is in your drink. In the [place] one drink equals one 12 oz bottle of beer (355 mL), one 5 oz glass of wine (148 mL), or one 1 oz glass of hard liquor (44 mL). General instructions  Schedule regular health, dental, and eye exams.  Stay current with your vaccines.  Tell your health care provider if: ? You often feel depressed. ? You have ever been abused or do not feel safe at home. Summary  Adopting a healthy lifestyle and getting preventive care are important in promoting health and wellness.  Follow your health care provider's instructions about healthy  diet, exercising, and getting tested or screened for diseases.  Follow your health care provider's instructions on monitoring your cholesterol and blood pressure. This information is not intended to replace advice given to you by your health care provider. Make sure you discuss any questions you have with your health care provider. Document Released: 02/05/2011 Document Revised: 07/16/2018 Document Reviewed: 07/16/2018 Elsevier Patient Education  2020 Else[employer] Inc[employer]  

## 2019-04-08 LAB — ANA, IFA COMPREHENSIVE PANEL
Anti Nuclear Antibody (ANA): NEGATIVE
ENA SM Ab Ser-aCnc: <1 AI
SM/RNP: <1 AI
SSA (Ro) (ENA) Antibody, IgG: <1 AI
SSB (La) (ENA) Antibody, IgG: <1 AI
Scleroderma (Scl-70) (ENA) Antibody, IgG: 1.2 AI — AB
ds DNA Ab: <1 IU/mL

## 2019-04-08 LAB — HEPATITIS C ANTIBODY
Hepatitis C Ab: NONREACTIVE
SIGNAL TO CUT-OFF: 0.01 (ref <1.00)

## 2019-04-15 ENCOUNTER — Telehealth: Payer: Self-pay | Admitting: Family Medicine

## 2019-04-15 DIAGNOSIS — R768 Other specified abnormal immunological findings in serum: Secondary | ICD-10-CM

## 2019-04-15 NOTE — Telephone Encounter (Signed)
Please inform patient the following information: Her hep c screen was negative. Her ANA (autoimmune screen) was negative, however a portion of the panel was very minimumly elevated  for scl-70- which can be associated with certain connective tissue disorders. Since the ANA was normal and the scl-70 was barely abnormal> I would suggest repeating the scl-70 for verification. - if it is truly positive- it could be the cause of her burning sensation of her feet and we would need to refer to a rheumatologist. If repeat is normal- no further work up needed.   Order placed- please schedule lab appt only. If + we will arrange a VV to discuss.

## 2019-04-16 NOTE — Telephone Encounter (Signed)
Pt was called and message was left to return call  

## 2019-04-16 NOTE — Telephone Encounter (Signed)
Pt was called and was set up for a lab appt, she was given lab results and verbalized understanding

## 2019-04-20 ENCOUNTER — Ambulatory Visit (INDEPENDENT_AMBULATORY_CARE_PROVIDER_SITE_OTHER): Payer: Medicare Other | Admitting: Family Medicine

## 2019-04-20 ENCOUNTER — Other Ambulatory Visit: Payer: Self-pay

## 2019-04-20 DIAGNOSIS — R768 Other specified abnormal immunological findings in serum: Secondary | ICD-10-CM | POA: Diagnosis not present

## 2019-04-21 ENCOUNTER — Telehealth: Payer: Self-pay | Admitting: Family Medicine

## 2019-04-21 DIAGNOSIS — R768 Other specified abnormal immunological findings in serum: Secondary | ICD-10-CM | POA: Insufficient documentation

## 2019-04-21 LAB — ANTI-SCLERODERMA ANTIBODY: Scleroderma (Scl-70) (ENA) Antibody, IgG: 1.2 AI — AB

## 2019-04-21 NOTE — Telephone Encounter (Signed)
Called patient to discuss her test result. Her test  again resulted with a very mild elevation in the SCL-70 antibody. This could be a false positive test with such a very low ab level and the ANA that was also negative.  We discussed options today and she elected to wait and monitor her symptoms, if symptoms start to worsen or she manifests any other scleroderma or like symptoms we will progress with further eval and rheumatology referral at that time.

## 2019-07-09 DIAGNOSIS — L720 Epidermal cyst: Secondary | ICD-10-CM | POA: Diagnosis not present

## 2019-07-09 DIAGNOSIS — L821 Other seborrheic keratosis: Secondary | ICD-10-CM | POA: Diagnosis not present

## 2019-07-09 DIAGNOSIS — L820 Inflamed seborrheic keratosis: Secondary | ICD-10-CM | POA: Diagnosis not present

## 2020-03-23 ENCOUNTER — Other Ambulatory Visit: Payer: Self-pay

## 2020-03-23 ENCOUNTER — Telehealth (INDEPENDENT_AMBULATORY_CARE_PROVIDER_SITE_OTHER): Payer: Medicare Other | Admitting: Family Medicine

## 2020-03-23 DIAGNOSIS — J069 Acute upper respiratory infection, unspecified: Secondary | ICD-10-CM | POA: Diagnosis not present

## 2020-03-23 NOTE — Progress Notes (Addendum)
Patient ID: Christy Kelly, female   DOB: 11-18-1950, 69 y.o.   MRN: [id number]   This visit type was conducted due to national recommendations for restrictions regarding the COVID-19 pandemic in an effort to limit this patient's exposure and mitigate transmission in our community.   Virtual Visit via Telephone Note  I connected with Christy Kelly on 03/23/20 at 11:30 AM EDT by telephone and verified that I am speaking with the correct person using two identifiers.   I discussed the limitations, risks, security and privacy concerns of performing an evaluation and management service by telephone and the availability of in person appointments. I also discussed with the patient that there may be a patient responsible charge related to this service. The patient expressed understanding and agreed to proceed.  Location patient: home Location provider: work or home office Participants present for the call: patient, provider Patient did not have a visit in the prior 7 days to address this/these issue(s).   History of Present Illness: Christy Kelly relates upper respiratory symptoms.  She noted Saturday some mild sore throat.  This was followed by Monday with some nasal congestion, headache, intermittent chills, and low-grade fever 100.9.  She denies any cough.  No dyspnea.  She is retired and relatively isolated.  No known sick contacts.  She had some nonspecific myalgias.  She had Moderna vaccines back in March.  She has taken over-the-counter Tylenol, guaifenesin, and phenylephrine which have helped.  Denies any nausea, vomiting, or diarrhea.  Generally fairly healthy.  Non-smoker  Past Medical History:  Diagnosis Date  . Allergy   . Chicken pox   . Hyperlipidemia   . Menopausal state   . 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 Primary exertional headache 09/05/2015  . Scoliosis    Minimal levoconvex lumbar rotary scoliosis.   Past Surgical History:  Procedure Laterality Date  . BUNIONECTOMY     x2 about 2012  bilateral.   . HYSTEROSCOPY  2001  . TONSILLECTOMY  1962  . WISDOM TOOTH EXTRACTION      reports that she has never smoked. She has never used smokeless tobacco. She reports that she does not drink alcohol and does not use drugs. family history includes Alcohol abuse in her brother; Breast cancer in her paternal grandmother; Cancer in her paternal grandfather; Early death in her father; Hypercholesterolemia in her brother; Hypertension in her brother; Stroke in her father. Allergies  Allergen Reactions  . Codeine Itching and Nausea And Vomiting      Observations/Objective: Patient sounds cheerful and well on the phone. I do not appreciate any SOB. Speech and thought processing are grossly intact. Patient reported vitals:  Assessment and Plan:  URI symptoms.  Suspect viral URI.  She does not describe any cough or dyspnea.  She has been Covid vaccinated.  We discussed the fact this could be breakthrough Covid infection versus other viral most likely  -We discussed possible Covid testing to further clarify.  She realizes treatment may be supportive since she is in no distress and has no cough or dyspnea -Plenty of fluids and rest -Continue over-the-counter medications as needed for nasal congestive symptoms -We have recommended minimum of 10-day quarantine from onset of symptoms if she is not Covid tested to further clarify -Follow-up immediately for any increased dyspnea or other concerns  Follow Up Instructions:  -As above   [id number] 5-10 [id number] 11-20 [id number] 21-30 I did not refer this patient for an OV in the next 24 hours for this/these issue(s).  I discussed the assessment and treatment plan with the patient. The patient was provided an opportunity to ask questions and all were answered. The patient agreed with the plan and demonstrated an understanding of the instructions.   The patient was advised to call back or seek an in-person evaluation if the symptoms worsen or if the  condition fails to improve as anticipated.  I provided 16 minutes of non-face-to-face time during this encounter.   Carolann Littler, MD

## 2020-05-26 ENCOUNTER — Ambulatory Visit (INDEPENDENT_AMBULATORY_CARE_PROVIDER_SITE_OTHER): Payer: Medicare Other | Admitting: Family Medicine

## 2020-05-26 ENCOUNTER — Other Ambulatory Visit: Payer: Self-pay

## 2020-05-26 ENCOUNTER — Encounter: Payer: Self-pay | Admitting: Family Medicine

## 2020-05-26 VITALS — BP 116/70 | HR 76 | Temp 98.2°F | Ht 68.0 in | Wt 158.0 lb

## 2020-05-26 DIAGNOSIS — Z79890 Hormone replacement therapy (postmenopausal): Secondary | ICD-10-CM | POA: Diagnosis not present

## 2020-05-26 DIAGNOSIS — Z1231 Encounter for screening mammogram for malignant neoplasm of breast: Secondary | ICD-10-CM

## 2020-05-26 DIAGNOSIS — R768 Other specified abnormal immunological findings in serum: Secondary | ICD-10-CM | POA: Diagnosis not present

## 2020-05-26 DIAGNOSIS — Z23 Encounter for immunization: Secondary | ICD-10-CM | POA: Diagnosis not present

## 2020-05-26 DIAGNOSIS — Z0000 Encounter for general adult medical examination without abnormal findings: Secondary | ICD-10-CM

## 2020-05-26 DIAGNOSIS — Z131 Encounter for screening for diabetes mellitus: Secondary | ICD-10-CM

## 2020-05-26 DIAGNOSIS — G629 Polyneuropathy, unspecified: Secondary | ICD-10-CM | POA: Diagnosis not present

## 2020-05-26 LAB — COMPREHENSIVE METABOLIC PANEL
ALT: 21 U/L (ref 0–35)
AST: 21 U/L (ref 0–37)
Albumin: 4.3 g/dL (ref 3.5–5.2)
Alkaline Phosphatase: 88 U/L (ref 39–117)
BUN: 14 mg/dL (ref 6–23)
CO2: 30 mEq/L (ref 19–32)
Calcium: 9.1 mg/dL (ref 8.4–10.5)
Chloride: 104 mEq/L (ref 96–112)
Creatinine, Ser: 0.98 mg/dL (ref 0.40–1.20)
GFR: 59.87 mL/min — ABNORMAL LOW (ref >60.00)
Glucose, Bld: 83 mg/dL (ref 70–99)
Potassium: 4.2 mEq/L (ref 3.5–5.1)
Sodium: 140 mEq/L (ref 135–145)
Total Bilirubin: 0.6 mg/dL (ref 0.2–1.2)
Total Protein: 6.8 g/dL (ref 6.0–8.3)

## 2020-05-26 LAB — CBC WITH DIFFERENTIAL/PLATELET
Basophils Absolute: 0.1 10*3/uL (ref 0.0–0.1)
Basophils Relative: 1.3 % (ref 0.0–3.0)
Eosinophils Absolute: 0 10*3/uL (ref 0.0–0.7)
Eosinophils Relative: 1 % (ref 0.0–5.0)
HCT: 36.1 % (ref 36.0–46.0)
Hemoglobin: 11.8 g/dL — ABNORMAL LOW (ref 12.0–15.0)
Lymphocytes Relative: 38.8 % (ref 12.0–46.0)
Lymphs Abs: 1.6 10*3/uL (ref 0.7–4.0)
MCHC: 32.8 g/dL (ref 30.0–36.0)
MCV: 79.9 fl (ref 78.0–100.0)
Monocytes Absolute: 0.4 10*3/uL (ref 0.1–1.0)
Monocytes Relative: 9.6 % (ref 3.0–12.0)
Neutro Abs: 2 10*3/uL (ref 1.4–7.7)
Neutrophils Relative %: 49.3 % (ref 43.0–77.0)
Platelets: 218 10*3/uL (ref 150.0–400.0)
RBC: 4.52 Mil/uL (ref 3.87–5.11)
RDW: 17.4 % — ABNORMAL HIGH (ref 11.5–15.5)
WBC: 4 10*3/uL (ref 4.0–10.5)

## 2020-05-26 LAB — LIPID PANEL
Cholesterol: 210 mg/dL — ABNORMAL HIGH (ref 0–200)
HDL: 48.6 mg/dL (ref >39.00)
LDL Cholesterol: 140 mg/dL — ABNORMAL HIGH (ref 0–99)
NonHDL: 161.12
Total CHOL/HDL Ratio: 4
Triglycerides: 106 mg/dL (ref 0.0–149.0)
VLDL: 21.2 mg/dL (ref 0.0–40.0)

## 2020-05-26 LAB — HEMOGLOBIN A1C: Hgb A1c MFr Bld: 6.1 % (ref 4.6–6.5)

## 2020-05-26 LAB — TSH: TSH: 2.4 u[IU]/mL (ref 0.35–4.50)

## 2020-05-26 MED ORDER — TETANUS-DIPHTH-ACELL PERTUSSIS 5-2.5-18.5 LF-MCG/0.5 IM SUSP: 0.5000 mL | Freq: Once | INTRAMUSCULAR | 0 refills | Status: AC

## 2020-05-26 NOTE — Patient Instructions (Signed)
Health Maintenance After Age 67 After age 67, you are at a higher risk for certain long-term diseases and infections as well as injuries from falls. Falls are a major cause of broken bones and head injuries in people who are older than age 67. Getting regular preventive care can help to keep you healthy and well. Preventive care includes getting regular testing and making lifestyle changes as recommended by your health care provider. Talk with your health care provider about:  Which screenings and tests you should have. A screening is a test that checks for a disease when you have no symptoms.  A diet and exercise plan that is right for you. What should I know about screenings and tests to prevent falls? Screening and testing are the best ways to find a health problem early. Early diagnosis and treatment give you the best chance of managing medical conditions that are common after age 67. Certain conditions and lifestyle choices may make you more likely to have a fall. Your health care provider may recommend:  Regular vision checks. Poor vision and conditions such as cataracts can make you more likely to have a fall. If you wear glasses, make sure to get your prescription updated if your vision changes.  Medicine review. Work with your health care provider to regularly review all of the medicines you are taking, including over-the-counter medicines. Ask your health care provider about any side effects that may make you more likely to have a fall. Tell your health care provider if any medicines that you take make you feel dizzy or sleepy.  Osteoporosis screening. Osteoporosis is a condition that causes the bones to get weaker. This can make the bones weak and cause them to break more easily.  Blood pressure screening. Blood pressure changes and medicines to control blood pressure can make you feel dizzy.  Strength and balance checks. Your health care provider may recommend certain tests to check your  strength and balance while standing, walking, or changing positions.  Foot health exam. Foot pain and numbness, as well as not wearing proper footwear, can make you more likely to have a fall.  Depression screening. You may be more likely to have a fall if you have a fear of falling, feel emotionally low, or feel unable to do activities that you used to do.  Alcohol use screening. Using too much alcohol can affect your balance and may make you more likely to have a fall. What actions can I take to lower my risk of falls? General instructions  Talk with your health care provider about your risks for falling. Tell your health care provider if: ? You fall. Be sure to tell your health care provider about all falls, even ones that seem minor. ? You feel dizzy, sleepy, or off-balance.  Take over-the-counter and prescription medicines only as told by your health care provider. These include any supplements.  Eat a healthy diet and maintain a healthy weight. A healthy diet includes low-fat dairy products, low-fat (lean) meats, and fiber from whole grains, beans, and lots of fruits and vegetables. Home safety  Remove any tripping hazards, such as rugs, cords, and clutter.  Install safety equipment such as grab bars in bathrooms and safety rails on stairs.  Keep rooms and walkways well-lit. Activity   Follow a regular exercise program to stay fit. This will help you maintain your balance. Ask your health care provider what types of exercise are appropriate for you.  If you need a cane or   walker, use it as recommended by your health care provider.  Wear supportive shoes that have nonskid soles. Lifestyle  Do not drink alcohol if your health care provider tells you not to drink.  If you drink alcohol, limit how much you have: ? 0-1 drink a day for women. ? 0-2 drinks a day for men.  Be aware of how much alcohol is in your drink. In the [place] one drink equals one typical bottle of beer (12  oz), one-half glass of wine (5 oz), or one shot of hard liquor (1 oz).  Do not use any products that contain nicotine or tobacco, such as cigarettes and e-cigarettes. If you need help quitting, ask your health care provider. Summary  Having a healthy lifestyle and getting preventive care can help to protect your health and wellness after age 67.  Screening and testing are the best way to find a health problem early and help you avoid having a fall. Early diagnosis and treatment give you the best chance for managing medical conditions that are more common for people who are older than age 67.  Falls are a major cause of broken bones and head injuries in people who are older than age 67. Take precautions to prevent a fall at home.  Work with your health care provider to learn what changes you can make to improve your health and wellness and to prevent falls. This information is not intended to replace advice given to you by your health care provider. Make sure you discuss any questions you have with your health care provider. Document Revised: 11/13/2018 Document Reviewed: 06/05/2017 Elsevier Patient Education  2020 [employer]  

## 2020-05-26 NOTE — Progress Notes (Signed)
This visit occurred during the SARS-CoV-2 public health emergency.  Safety protocols were in place, including screening questions prior to the visit, additional usage of staff PPE, and extensive cleaning of exam room while observing appropriate contact time as indicated for disinfecting solutions.    Patient ID: Christy Kelly, female  DOB: 07/18/1951, 69 y.o.   MRN: [id number] Patient Care Team    Relationship Specialty Notifications Start End  Ma Hillock, DO PCP - General Family Medicine  02/13/19   Dian Queen, MD Consulting Physician Obstetrics and Gynecology  02/16/19   Satira Sark, MD Consulting Physician Cardiology  04/07/19   Penni Bombard, MD Consulting Physician Neurology  04/07/19     Chief Complaint  Patient presents with  . Annual Exam    Subjective:  Christy Kelly is a 69 y.o.  Female  present for CPE. All past medical history, surgical history, allergies, family history, immunizations, medications and social history were updated in the electronic medical record today. All recent labs, ED visits and hospitalizations within the last year were reviewed.  Arthralgia, unspecified joint/myalgia/DDD, Lumbar spine Pt mentions her chronic  Discomfort with laying flat with her feet burning.  labs collected 04/2019 surrounding possible causes were all normal with the except of a weak positive scl-70 with neg ANA. XRay Stable mild anterior spur formation at the L3-4 level and minimal anterior spur formation at the L4-5 level. Facet degenerative changes throughout the lumbar spine, most pronounced at the L5-S1 level. No fractures, pars defects or subluxations. Prominent stool throughout the colon.   Prior note:  Patient reports for the past 1 to 2 years whenever she lays flat at night her bilateral feet start to burn. She states that it started in 1 foot and has slowly progressed to bilateral condition and now affecting all of her toes. She denies any bladder or  bowel dysfunction. She reports a sledding accident in 2015. She states she was cleared at that time without any acute injury. She was having some discomfort a few years after and reports seeing a neurologist and having an MRI of her spine completed. She denies any daytime symptoms.She denies any numbness. She denies any tripping or falling secondary to weakness in her lower extremities.  Health maintenance:  Colonoscopy: completed 01/2014, by Dr. Olevia Perches. follow up 10 yr. Mammogram: completed:08/2018, competed at gyn. She has been unable to schedule with gyn d/t conflicting schedules and MM now over a yr overdue. She is willing to have breast center and has some rpted there in the past> order placed.  Cervical cancer screening: > 65 followed by gyn Immunizations: tdap 2008-DUE printed 2nd time, Influenza administered today (encouraged yearly), PNA series completed today, shingrix series completed 01/2019, covid series completed Infectious disease screening:  Hep C completed DEXA: last completed 08/2018, result -1.3, follow by GYN Assistive device: none Oxygen PPI:RJJO Patient has a Dental home. Hospitalizations/ED visits: reviewed   Depression screen [facility] 2/9 05/26/2020 04/07/2019 02/13/2019  Decreased Interest 0 0 0  Down, Depressed, Hopeless 0 0 0  PHQ - 2 Score 0 0 0   No flowsheet data found.   Immunization History  Administered Date(s) Administered  . Fluad Quad(high Dose 65+) 04/07/2019, 05/26/2020  . Moderna SARS-COVID-2 Vaccination 10/01/2019, 10/30/2019  . Pneumococcal Conjugate-13 05/26/2020  . Pneumococcal Polysaccharide-23 08/23/2018  . Tdap 08/31/2006  . Zoster Recombinat (Shingrix) 08/23/2018, 01/23/2019     Past Medical History:  Diagnosis Date  . Allergy   . Chicken pox   .  Hyperlipidemia   . Menopausal state   . 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 Primary exertional headache 09/05/2015  . Scoliosis    Minimal levoconvex lumbar rotary scoliosis.   Allergies   Allergen Reactions  . Codeine Itching and Nausea And Vomiting   Past Surgical History:  Procedure Laterality Date  . BUNIONECTOMY     x2 about 2012 bilateral.   . HYSTEROSCOPY  2001  . TONSILLECTOMY  1962  . WISDOM TOOTH EXTRACTION     Family History  Problem Relation Age of Onset  . Stroke Father   . Early death Father   . Hypertension Brother   . Hypercholesterolemia Brother   . Alcohol abuse Brother   . Breast cancer Paternal Grandmother   . Cancer Paternal Grandfather        Uncertain type- Li fraumeni gene  . Colon cancer Neg Hx   . Esophageal cancer Neg Hx   . Rectal cancer Neg Hx   . Stomach cancer Neg Hx    Social History   Social History Narrative   Marital status/children/pets: married, lives with husband.    Education/employment: 1 year of college-retired.  Worked as a Information systems manager.   Safety:      -smoke alarm in the home:Yes     - wears seatbelt: Yes     - Feels safe in their relationships: Yes          Allergies as of 05/26/2020      Reactions   Codeine Itching, Nausea And Vomiting      Medication List       Accurate as of May 26, 2020 11:50 AM. If you have any questions, ask your nurse or doctor.        STOP taking these medications   CALCIUM PO Stopped by: Howard Pouch, DO     TAKE these medications   Calcium 600 600 MG Tabs tablet Generic drug: calcium carbonate Take 600 mg by mouth 2 (two) times daily with a meal.   CoQ10 100 MG Caps Take 1 capsule by mouth daily.   estradiol 0.1 MG/GM vaginal cream Commonly known as: ESTRACE Place 1 Applicatorful vaginally once a week.   Fish Oil 1000 MG Caps Take 1,000 mg by mouth daily.   MAGNESIUM PO Take 500 mg by mouth daily.   NERVINE PO Take by mouth.   Tdap 5-2.5-18.5 LF-MCG/0.5 injection Commonly known as: BOOSTRIX Inject 0.5 mLs into the muscle once for 1 dose. Started by: Howard Pouch, DO   vitamin B-12 250 MCG tablet Commonly known as:  CYANOCOBALAMIN Take 250 mcg by mouth daily.   Vitamin D 50 MCG (2000 UT) Caps Take 1 capsule by mouth daily.   vitamin E 180 MG (400 UNITS) capsule Take 400 Units by mouth daily.   ZYRTEC PO Take by mouth.       All past medical history, surgical history, allergies, family history, immunizations andmedications were updated in the EMR today and reviewed under the history and medication portions of their EMR.     No results found for this or any previous visit (from the past 2160 hour(s)).   ROS: 14 pt review of systems performed and negative (unless mentioned in an HPI)  Objective: BP 116/70   Pulse 76   Temp 98.2 F (36.8 C) (Oral)   Ht 5\' 8"  (1.727 m)   Wt 158 lb (71.7 kg)   SpO2 100%   BMI 24.02 kg/m  Gen: Afebrile. No acute distress. Nontoxic in appearance,  well-developed, well-nourished,  Pleasant female.  HENT: AT. [place]. Bilateral TM visualized and normal in appearance, normal external auditory canal. MMM, no oral lesions, adequate dentition. Bilateral nares within normal limits. Throat without erythema, ulcerations or exudates. no Cough on exam, no hoarseness on exam. Eyes:Pupils Equal Round Reactive to light, Extraocular movements intact,  Conjunctiva without redness, discharge or icterus. Neck/lymp/endocrine: Supple,no lymphadenopathy, no thyromegaly CV: RRR no murmur, no edema, +2/4 P posterior tibialis pulses.  Chest: CTAB, no wheeze, rhonchi or crackles. normal Respiratory effort. good Air movement. Abd: Soft. flat. NTND. BS present. no Masses palpated. No hepatosplenomegaly. No rebound tenderness or guarding. Skin: no rashes, purpura or petechiae. Warm and well-perfused. Skin intact. Neuro/Msk:  Normal gait. PERLA. EOMi. Alert. Oriented x3.  Cranial nerves II through XII intact. Muscle strength 5/5 upper/lower extremity. DTRs equal bilaterally. Psych: Normal affect, dress and demeanor. Normal speech. Normal thought content and judgment.  No exam data  present  Assessment/plan: Christy Kelly is a 69 y.o. 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  Symptoms most likely from compression/bone spurs with laying flat from her DDD. She has declined medications or surgical intervention last year.  - Anti-scleroderma antibody rpt (was weak positive w/ neg ana) unlikely cause, but will monitor to be complete - if she changes her mind on further intervention could obtain MRI and consider referral to neurology.  Encounter for preventive health examination Patient was encouraged to exercise greater than 150 minutes a week. Patient was encouraged to choose a diet filled with fresh fruits and vegetables, and lean meats. AVS provided to patient today for education/recommendation on gender specific health and safety maintenance. Colonoscopy: completed 01/2014, by Dr. Olevia Perches. follow up 10 yr. Mammogram: completed:08/2018, competed at gyn. She has been unable to schedule with gyn d/t conflicting schedules and MM now over a yr overdue. She is willing to have breast center and has some rpted there in the past> order placed.  Cervical cancer screening: > 65 followed by gyn Immunizations: tdap 2008-DUE printed 2nd time, Influenza administered today (encouraged yearly), PNA series completed today, shingrix series completed 01/2019, covid series completed Infectious disease screening:  Hep C completed DEXA: last completed 08/2018, result -1.3, follow by  GYN  Return in about 1 year (around 05/26/2021) for CPE (30 min).   Orders Placed This Encounter  Procedures  . MM 3D SCREEN BREAST BILATERAL  . Flu Vaccine QUAD High Dose(Fluad)  . Pneumococcal conjugate vaccine 13-valent IM  . CBC with Differential/Platelet  . Comprehensive metabolic panel  . Hemoglobin A1c  . Lipid panel  . TSH  . Anti-scleroderma antibody   Meds ordered this encounter  Medications  . Tdap (BOOSTRIX) 5-2.5-18.5 LF-MCG/0.5 injection    Sig: Inject 0.5 mLs into the muscle once for 1 dose.    Dispense:  0.5 mL    Refill:  0   Referral Orders  No referral(s) requested today     Electronically signed by: Howard Pouch, [place]

## 2020-05-27 LAB — ANTI-SCLERODERMA ANTIBODY: Scleroderma (Scl-70) (ENA) Antibody, IgG: 1 AI — AB

## 2020-05-30 ENCOUNTER — Telehealth: Payer: Self-pay | Admitting: Family Medicine

## 2020-05-30 NOTE — Telephone Encounter (Signed)
Patient returned call for lab results. Please call patient when available.

## 2020-05-30 NOTE — Telephone Encounter (Signed)
Spoke with pt about lab results.

## 2021-03-07 ENCOUNTER — Emergency Department (HOSPITAL_COMMUNITY)
Admission: EM | Admit: 2021-03-07 | Discharge: 2021-03-07 | Disposition: A | Discharge status: Home / Self Care | Payer: Medicare Other | Attending: Emergency Medicine | Admitting: Emergency Medicine

## 2021-03-07 ENCOUNTER — Other Ambulatory Visit: Payer: Self-pay

## 2021-03-07 ENCOUNTER — Encounter (HOSPITAL_COMMUNITY): Payer: Self-pay | Admitting: Emergency Medicine

## 2021-03-07 ENCOUNTER — Telehealth: Payer: Self-pay | Admitting: Family Medicine

## 2021-03-07 ENCOUNTER — Emergency Department (HOSPITAL_COMMUNITY): Payer: Medicare Other

## 2021-03-07 DIAGNOSIS — K5730 Diverticulosis of large intestine without perforation or abscess without bleeding: Secondary | ICD-10-CM | POA: Diagnosis not present

## 2021-03-07 DIAGNOSIS — K8500 Idiopathic acute pancreatitis without necrosis or infection: Secondary | ICD-10-CM | POA: Insufficient documentation

## 2021-03-07 DIAGNOSIS — R109 Unspecified abdominal pain: Secondary | ICD-10-CM | POA: Diagnosis present

## 2021-03-07 DIAGNOSIS — K5900 Constipation, unspecified: Secondary | ICD-10-CM | POA: Diagnosis not present

## 2021-03-07 LAB — COMPREHENSIVE METABOLIC PANEL
ALT: 40 U/L (ref 0–44)
AST: 44 U/L — ABNORMAL HIGH (ref 15–41)
Albumin: 3.4 g/dL — ABNORMAL LOW (ref 3.5–5.0)
Alkaline Phosphatase: 140 U/L — ABNORMAL HIGH (ref 38–126)
Anion gap: 8 (ref 5–15)
BUN: 10 mg/dL (ref 8–23)
CO2: 24 mmol/L (ref 22–32)
Calcium: 8.8 mg/dL — ABNORMAL LOW (ref 8.9–10.3)
Chloride: 103 mmol/L (ref 98–111)
Creatinine, Ser: 0.99 mg/dL (ref 0.44–1.00)
GFR, Estimated: >60 mL/min (ref >60)
Glucose, Bld: 95 mg/dL (ref 70–99)
Potassium: 3.7 mmol/L (ref 3.5–5.1)
Sodium: 135 mmol/L (ref 135–145)
Total Bilirubin: 0.7 mg/dL (ref 0.3–1.2)
Total Protein: 7.1 g/dL (ref 6.5–8.1)

## 2021-03-07 LAB — CBC WITH DIFFERENTIAL/PLATELET
Abs Immature Granulocytes: 0.01 10*3/uL (ref 0.00–0.07)
Basophils Absolute: 0 10*3/uL (ref 0.0–0.1)
Basophils Relative: 1 %
Eosinophils Absolute: 0 10*3/uL (ref 0.0–0.5)
Eosinophils Relative: 0 %
HCT: 32.3 % — ABNORMAL LOW (ref 36.0–46.0)
Hemoglobin: 9.9 g/dL — ABNORMAL LOW (ref 12.0–15.0)
Immature Granulocytes: 0 %
Lymphocytes Relative: 27 %
Lymphs Abs: 1 10*3/uL (ref 0.7–4.0)
MCH: 22.4 pg — ABNORMAL LOW (ref 26.0–34.0)
MCHC: 30.7 g/dL (ref 30.0–36.0)
MCV: 73.2 fL — ABNORMAL LOW (ref 80.0–100.0)
Monocytes Absolute: 0.4 10*3/uL (ref 0.1–1.0)
Monocytes Relative: 10 %
Neutro Abs: 2.4 10*3/uL (ref 1.7–7.7)
Neutrophils Relative %: 62 %
Platelets: 180 10*3/uL (ref 150–400)
RBC: 4.41 MIL/uL (ref 3.87–5.11)
RDW: 18.9 % — ABNORMAL HIGH (ref 11.5–15.5)
WBC: 3.8 10*3/uL — ABNORMAL LOW (ref 4.0–10.5)
nRBC: 0 % (ref 0.0–0.2)

## 2021-03-07 LAB — URINALYSIS, ROUTINE W REFLEX MICROSCOPIC
Bilirubin Urine: NEGATIVE
Glucose, UA: NEGATIVE mg/dL
Hgb urine dipstick: NEGATIVE
Ketones, ur: 5 mg/dL — AB
Leukocytes,Ua: NEGATIVE
Nitrite: NEGATIVE
Protein, ur: NEGATIVE mg/dL
Specific Gravity, Urine: 1.019 (ref 1.005–1.030)
pH: 6 (ref 5.0–8.0)

## 2021-03-07 LAB — LIPASE, BLOOD: Lipase: 192 U/L — ABNORMAL HIGH (ref 11–51)

## 2021-03-07 MED ORDER — ACETAMINOPHEN 325 MG PO TABS
650.0000 mg | ORAL_TABLET | Freq: Once | ORAL | Status: AC
  Administered 2021-03-07: 650 mg via ORAL
  Filled 2021-03-07: qty 2

## 2021-03-07 MED ORDER — SODIUM CHLORIDE 0.9 % IV BOLUS
1000.0000 mL | Freq: Once | INTRAVENOUS | Status: AC
  Administered 2021-03-07: 1000 mL via INTRAVENOUS

## 2021-03-07 MED ORDER — IOHEXOL 300 MG/ML  SOLN
100.0000 mL | Freq: Once | INTRAMUSCULAR | Status: AC | PRN
  Administered 2021-03-07: 100 mL via INTRAVENOUS

## 2021-03-07 MED ORDER — ONDANSETRON 4 MG PO TBDP: 4.0000 mg | ORAL_TABLET | Freq: Three times a day (TID) | ORAL | 0 refills | Status: DC | PRN

## 2021-03-07 NOTE — Telephone Encounter (Signed)
Please assist pt with scheduling

## 2021-03-07 NOTE — Telephone Encounter (Signed)
noted 

## 2021-03-07 NOTE — Telephone Encounter (Signed)
Pt states that she had a "fever" of 98, pt informed that is not considered an fever and denies and nausea.   Pt c/o abd pain and loss of appetite starting Friday. Please Arnoldo Hooker if this is considered simple or if pt should be worked in next avail slot. Covid test Sunday and Monday both neg.

## 2021-03-07 NOTE — ED Provider Notes (Signed)
[facility] EMERGENCY DEPARTMENT Provider Note   CSN: [id number] Arrival date & time: 03/07/21  1417     History Chief Complaint  Patient presents with   Abdominal Pain    Christy Kelly is a 70 y.o. female.  70 year old female presents with complaint of abdominal pain with fever onset Thursday (5 days ago).  Reports max temp of 101 at home.  Reports last bowel movement 1 week ago, reports decreased p.o. intake.  Denies nausea, vomiting, diarrhea, denies changes in bladder habits.  Denies drug or alcohol use, sick contacts.  States that about a year ago she had abdominal pain, PCP sent her for CT scan which showed she was constipated.  Patient has been taking MiraLAX for the past month without improvement in her bowel habits.  Reports feeling distended with 12 pound weight gain over the past year.      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    HYSTEROSCOPY  2001   TONSILLECTOMY  1962   WISDOM TOOTH EXTRACTION       OB History     Gravida  2   Para  2   Term      Preterm      AB      Living  2      SAB      IAB      Ectopic      Multiple      Live Births              Family History  Problem Relation Age of Onset   Stroke Father    Early death Father    Hypertension Brother    Hypercholesterolemia Brother    Alcohol abuse Brother    Breast cancer Paternal Grandmother    Cancer Paternal Grandfather        Uncertain type- Li fraumeni gene    Colon cancer Neg Hx    Esophageal cancer Neg Hx    Rectal cancer Neg Hx    Stomach cancer Neg Hx     Social History   Tobacco Use   Smoking status: Never   Smokeless tobacco: Never  Vaping Use   Vaping Use: Never used  Substance Use Topics   Alcohol use: No   Drug use: No    Home Medications Prior to Admission medications   Medication Sig Start Date End Date Taking? Authorizing Provider  ondansetron (ZOFRAN ODT) 4 MG disintegrating tablet Take 1 tablet (4 mg total) by mouth every 8 (eight) hours as needed for nausea or vomiting. 03/07/21  Yes Tacy Learn, PA-C  calcium carbonate (CALCIUM 600) 600 MG TABS tablet Take 600 mg by mouth 2 (two) times daily with a meal.    [provider]  Cetirizine HCl (ZYRTEC PO) Take by mouth.    [provider]  Cholecalciferol (VITAMIN D) 50 MCG (2000 UT) CAPS Take 1 capsule by mouth daily.  [provider]  Coenzyme Q10 (COQ10) 100 MG CAPS Take 1 capsule by mouth daily.    [provider]  diphenhydrAMINE HCl (NERVINE PO) Take by mouth.    [provider]  estradiol (ESTRACE) 0.1 MG/GM vaginal cream Place 1 Applicatorful vaginally once a week.    [provider]  MAGNESIUM PO Take 500 mg by mouth daily.     [provider]  Omega-3 Fatty Acids (FISH OIL) 1000 MG CAPS Take 1,000 mg by mouth daily.    [provider]  vitamin B-12 (CYANOCOBALAMIN) 250 MCG tablet Take 250 mcg by mouth daily.    [provider]  vitamin E 400 UNIT capsule Take 400 Units by mouth daily.    [provider]    Allergies    Codeine  Review of Systems   Review of Systems  Constitutional:  Positive for appetite change, fever and unexpected weight change.  Respiratory:  Negative for shortness of breath.   Cardiovascular:  Negative for chest pain.  Gastrointestinal:  Positive for abdominal distention and abdominal pain. Negative for diarrhea, nausea and vomiting.   Genitourinary:  Negative for dysuria and frequency.  Musculoskeletal:  Positive for back pain.  Skin:  Negative for rash and wound.  Allergic/Immunologic: Negative for immunocompromised state.  Neurological:  Negative for weakness and numbness.  Hematological:  Negative for adenopathy.  Psychiatric/Behavioral:  Negative for confusion.   All other systems reviewed and are negative.  Physical Exam Updated Vital Signs BP 129/72 (BP Location: Right Arm)   Pulse 88   Temp 99.4 F (37.4 C) (Oral)   Resp 20   SpO2 99%   Physical Exam Vitals and nursing note reviewed.  Constitutional:      General: She is not in acute distress.    Appearance: She is well-developed. She is not diaphoretic.  HENT:     Head: Normocephalic and atraumatic.  Cardiovascular:     Rate and Rhythm: Normal rate and regular rhythm.     Pulses: Normal pulses.     Heart sounds: Normal heart sounds.  Pulmonary:     Effort: Pulmonary effort is normal.     Breath sounds: Normal breath sounds.  Abdominal:     Palpations: Abdomen is soft.     Tenderness: There is generalized abdominal tenderness.  Musculoskeletal:     Right lower leg: No edema.     Left lower leg: No edema.  Skin:    General: Skin is warm and dry.     Findings: No erythema or rash.  Neurological:     Mental Status: She is alert and oriented to person, place, and time.  Psychiatric:        Behavior: Behavior normal.    ED Results / Procedures / Treatments   Labs (all labs ordered are listed, but only abnormal results are displayed) Labs Reviewed  CBC WITH DIFFERENTIAL/PLATELET - Abnormal; Notable for the following components:      Result Value   WBC 3.8 (*)    Hemoglobin 9.9 (*)    HCT 32.3 (*)    MCV 73.2 (*)    MCH 22.4 (*)    RDW 18.9 (*)    All other components within normal limits  COMPREHENSIVE METABOLIC PANEL - Abnormal; Notable for the following components:   Calcium 8.8 (*)    Albumin 3.4 (*)    AST 44 (*)    Alkaline  Phosphatase 140 (*)    All other components within normal limits  LIPASE,  BLOOD - Abnormal; Notable for the following components:   Lipase 192 (*)    All other components within normal limits  URINALYSIS, ROUTINE W REFLEX MICROSCOPIC - Abnormal; Notable for the following components:   Ketones, ur 5 (*)    All other components within normal limits    EKG None  Radiology CT Abdomen Pelvis W Contrast  Result Date: 03/07/2021 CLINICAL DATA:  Abdomen pain and constipation EXAM: CT ABDOMEN AND PELVIS WITH CONTRAST TECHNIQUE: Multidetector CT imaging of the abdomen and pelvis was performed using the standard protocol following bolus administration of intravenous contrast. CONTRAST:  153m OMNIPAQUE IOHEXOL 300 MG/ML  SOLN COMPARISON:  None. FINDINGS: Lower chest: No acute abnormality. Hepatobiliary: No focal liver abnormality is seen. No gallstones, gallbladder wall thickening, or biliary dilatation. Pancreas: Unremarkable. No pancreatic ductal dilatation or surrounding inflammatory changes. Spleen: Normal in size without focal abnormality. Adrenals/Urinary Tract: Adrenal glands are unremarkable. Kidneys are normal, without renal calculi, focal lesion, or hydronephrosis. Bladder is unremarkable. Stomach/Bowel: Stomach is within normal limits. Appendix appears normal. No evidence of bowel wall thickening, distention, or inflammatory changes. Large volume of stool in the colon. 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 No enlarged abdominal or pelvic lymph nodes. Reproductive: Uterus and bilateral adnexa are unremarkable. Other: Negative for pelvic effusion or free air Musculoskeletal: No acute or significant osseous findings. IMPRESSION: 1. No CT evidence for acute intra-abdominal or pelvic abnormality. 2. Large volume of stool in the colon corresponding to history of constipation. No acute bowel wall thickening or  obstructive changes 3. 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   On: 03/07/2021 19:03    Procedures Procedures   Medications Ordered in ED Medications  acetaminophen (TYLENOL) tablet 650 mg (650 mg Oral Given 03/07/21 1503)  sodium chloride 0.9 % bolus 1,000 mL (0 mLs Intravenous Stopped 03/07/21 1956)  iohexol (OMNIPAQUE) 300 MG/ML solution 100 mL (100 mLs Intravenous Contrast Given 03/07/21 1840)    ED Course  I have reviewed the triage vital signs and the nursing notes.  Pertinent labs & imaging results that were available during my care of the patient were reviewed by me and considered in my medical decision making (see chart for details).  Clinical Course as of 03/07/21 2005  Tue Aug 02, 27250 [id number] old female with complaint of abdominal pain radiating to her back with fever as above.  Patient is well-appearing on exam, triage temperature of 100.8, was given Tylenol in triage.  Room air O2 sat 100%.  Found to have generalized mild abdominal tenderness. Labs with slight decrease in hemoglobin from prior, currently 9.9, white blood cell count is 3.8.  Her CMP has an elevated alk phos to 140 and AST of 44 with normal renal function.  Lipase is elevated at 192, no history of pancreatitis, denies alcohol use.  Urinalysis negative for UTI.  CT abdomen pelvis showing constipation, no gallstones, no acute changes surrounding the pancreas. Case discussed with Dr. DRoslynn Amble ER attending, agrees with plan of care for discharge home with GI follow-up.  Discussed results and plan of care with patient who is agreeable with plan, declines narcotic pain medications today, will take Tylenol as needed, given prescription for Zofran as needed for nausea and vomiting with return to ER precautions. [LM]    Clinical Course User Index [LM] MRoque Lias  MDM Rules/Calculators/A&P  Final Clinical  Impression(s) / ED Diagnoses Final diagnoses:  Idiopathic acute pancreatitis without infection or necrosis  Constipation, unspecified constipation type    Rx / DC Orders ED Discharge Orders          Ordered    ondansetron (ZOFRAN ODT) 4 MG disintegrating tablet  Every 8 hours PRN        03/07/21 1956             Roque Lias 03/07/21 2006    Lucrezia Starch, MD 03/11/21 2146

## 2021-03-07 NOTE — Telephone Encounter (Signed)
FYI: Spoke with patient and offered Friday 1130 appointment. She declined and stated she is on her way to the ED at this time.

## 2021-03-07 NOTE — ED Provider Notes (Signed)
Emergency Medicine Provider Triage Evaluation Note  Christy Kelly , a 70 y.o. female  was evaluated in triage.  Pt complains of abd pain and constipation x1 wee.  Review of Systems  Positive: Abd pain, constipation Negative: nvd  Physical Exam  BP 121/69   Pulse 98   Temp (!) 100.8 F (38.2 C) (Oral)   Resp 14   SpO2 100%  Gen:   Awake, no distress   Resp:  Normal effort  MSK:   Moves extremities without difficulty  Other:  Diffuse abd ttp, decreased bs  Medical Decision Making  Medically screening exam initiated at 2:46 PM.  Appropriate orders placed.  Christy Kelly was informed that the remainder of the evaluation will be completed by another provider, this initial triage assessment does not replace that evaluation, and the importance of remaining in the ED until their evaluation is complete.     Christy Kelly, [place] 03/07/21 1448    Wyvonnia Dusky, MD 03/07/21 [phone]

## 2021-03-07 NOTE — Telephone Encounter (Signed)
Friday at 1130 is the earliest I have.

## 2021-03-07 NOTE — Discharge Instructions (Addendum)
Follow-up with your gastroenterologist, call to schedule an appointment. Return to the emergency room for worsening or concerning symptoms. Take Tylenol as needed as directed for pain.  Take Zofran as needed as prescribed for nausea and vomiting.

## 2021-03-07 NOTE — ED Triage Notes (Signed)
Pt here from home with c/op abd pain and constipation , no BM in 1 week also has a fever

## 2021-03-07 NOTE — Telephone Encounter (Signed)
Patient requesting in person appt, she reported Fevers on and off, bloating, not wanting to eat and Covid neg test at home. I offered VV at 2:30 and patient became upset and wanted to be seen in person. I again explained the process due to her symptoms at which time she stated she had not had the fever for two days and was not nauseous, just did not want to eat. Patient insisted I ask for in person appt. Please call patient to advise.

## 2021-03-08 ENCOUNTER — Encounter: Payer: Self-pay | Admitting: Physician Assistant

## 2021-03-10 ENCOUNTER — Emergency Department (HOSPITAL_BASED_OUTPATIENT_CLINIC_OR_DEPARTMENT_OTHER)
Admission: EM | Admit: 2021-03-10 | Discharge: 2021-03-11 | Disposition: A | Discharge status: Home / Self Care | Payer: Medicare Other | Attending: Emergency Medicine | Admitting: Emergency Medicine

## 2021-03-10 ENCOUNTER — Emergency Department (HOSPITAL_BASED_OUTPATIENT_CLINIC_OR_DEPARTMENT_OTHER): Payer: Medicare Other

## 2021-03-10 ENCOUNTER — Other Ambulatory Visit: Payer: Self-pay

## 2021-03-10 ENCOUNTER — Encounter (HOSPITAL_BASED_OUTPATIENT_CLINIC_OR_DEPARTMENT_OTHER): Payer: Self-pay

## 2021-03-10 ENCOUNTER — Other Ambulatory Visit: Payer: Self-pay | Admitting: Physician Assistant

## 2021-03-10 DIAGNOSIS — R748 Abnormal levels of other serum enzymes: Secondary | ICD-10-CM | POA: Insufficient documentation

## 2021-03-10 DIAGNOSIS — R509 Fever, unspecified: Secondary | ICD-10-CM | POA: Insufficient documentation

## 2021-03-10 DIAGNOSIS — R1012 Left upper quadrant pain: Secondary | ICD-10-CM | POA: Diagnosis not present

## 2021-03-10 DIAGNOSIS — R945 Abnormal results of liver function studies: Secondary | ICD-10-CM | POA: Diagnosis not present

## 2021-03-10 DIAGNOSIS — R1011 Right upper quadrant pain: Secondary | ICD-10-CM | POA: Diagnosis not present

## 2021-03-10 DIAGNOSIS — R1013 Epigastric pain: Secondary | ICD-10-CM | POA: Diagnosis not present

## 2021-03-10 DIAGNOSIS — D509 Iron deficiency anemia, unspecified: Secondary | ICD-10-CM | POA: Diagnosis not present

## 2021-03-10 DIAGNOSIS — R1084 Generalized abdominal pain: Secondary | ICD-10-CM

## 2021-03-10 LAB — COMPREHENSIVE METABOLIC PANEL
ALT: 31 U/L (ref 0–44)
AST: 37 U/L (ref 15–41)
Albumin: 3.6 (ref 3.5–5.0)
Albumin: 3.6 g/dL (ref 3.5–5.0)
Alkaline Phosphatase: 235 U/L — ABNORMAL HIGH (ref 38–126)
Anion gap: 10 (ref 5–15)
BUN: 10 mg/dL (ref 8–23)
CO2: 24 mmol/L (ref 22–32)
Calcium: 8.5 mg/dL — ABNORMAL LOW (ref 8.9–10.3)
Calcium: 9.5 (ref 8.7–10.7)
Chloride: 104 mmol/L (ref 98–111)
Creatinine, Ser: 0.78 mg/dL (ref 0.44–1.00)
GFR calc non Af Amer: 79
GFR, Estimated: >60 mL/min (ref >60)
Glucose, Bld: 77 mg/dL (ref 70–99)
Potassium: 3.6 mmol/L (ref 3.5–5.1)
Sodium: 138 mmol/L (ref 135–145)
Total Bilirubin: 0.5 mg/dL (ref 0.3–1.2)
Total Protein: 6.6 g/dL (ref 6.5–8.1)

## 2021-03-10 LAB — BASIC METABOLIC PANEL
BUN: 9 (ref 4–21)
CO2: 25 — AB (ref 13–22)
Chloride: 105 (ref 99–108)
Creatinine: 0.8 (ref 0.5–1.1)
Glucose: 76
Potassium: 3.7 (ref 3.4–5.3)
Sodium: 139 (ref 137–147)

## 2021-03-10 LAB — CBC WITH DIFFERENTIAL/PLATELET
Abs Immature Granulocytes: 0.02 10*3/uL (ref 0.00–0.07)
Basophils Absolute: 0.1 10*3/uL (ref 0.0–0.1)
Basophils Relative: 1 %
Eosinophils Absolute: 0.1 10*3/uL (ref 0.0–0.5)
Eosinophils Relative: 1 %
HCT: 27.9 % — ABNORMAL LOW (ref 36.0–46.0)
Hemoglobin: 8.7 g/dL — ABNORMAL LOW (ref 12.0–15.0)
Immature Granulocytes: 0 %
Lymphocytes Relative: 33 %
Lymphs Abs: 1.9 10*3/uL (ref 0.7–4.0)
MCH: 22.3 pg — ABNORMAL LOW (ref 26.0–34.0)
MCHC: 31.2 g/dL (ref 30.0–36.0)
MCV: 71.4 fL — ABNORMAL LOW (ref 80.0–100.0)
Monocytes Absolute: 0.7 10*3/uL (ref 0.1–1.0)
Monocytes Relative: 13 %
Neutro Abs: 3 10*3/uL (ref 1.7–7.7)
Neutrophils Relative %: 52 %
Platelets: 225 10*3/uL (ref 150–400)
RBC: 3.91 MIL/uL (ref 3.87–5.11)
RDW: 19 % — ABNORMAL HIGH (ref 11.5–15.5)
WBC: 5.7 10*3/uL (ref 4.0–10.5)
nRBC: 0 % (ref 0.0–0.2)

## 2021-03-10 LAB — CBC AND DIFFERENTIAL
HCT: 30 — AB (ref 36–46)
Hemoglobin: 9.3 — AB (ref 12.0–16.0)
Platelets: 210 (ref 150–399)
WBC: 5.2

## 2021-03-10 LAB — HEPATIC FUNCTION PANEL
ALT: 30 (ref 7–35)
AST: 40 — AB (ref 13–35)
Alkaline Phosphatase: 235 — AB (ref 25–125)
Bilirubin, Total: 0.5

## 2021-03-10 LAB — LIPASE, BLOOD: Lipase: 69 U/L — ABNORMAL HIGH (ref 11–51)

## 2021-03-10 LAB — CBC: RBC: 4.25 (ref 3.87–5.11)

## 2021-03-10 MED ORDER — LACTATED RINGERS IV BOLUS
1000.0000 mL | Freq: Once | INTRAVENOUS | Status: AC
  Administered 2021-03-10: 1000 mL via INTRAVENOUS

## 2021-03-10 NOTE — ED Triage Notes (Addendum)
Pt is present for centralized abd pain, fever, and chills x one week. Pt was seen at [facility] on Tuesday and d/c her with idopathic pancreatitis. She followed up with GI today and they did blood work, and over the last few days her ALP has become more elevated. Concerned for possible cholecystitis vs pancreatitis.

## 2021-03-10 NOTE — ED Provider Notes (Signed)
[facility] EMERGENCY DEPT Provider Note   CSN: [id number] Arrival date & time: 03/10/21  1852     History Chief Complaint  Patient presents with   Abdominal Pain    Christy Kelly is a 70 y.o. female.  Patient is a 70-year-old female with no significant past medical history presenting today with a approximate 1 week history of upper abdominal pain, fever, chills that has been persistent.  Patient was seen 3 days ago in the emergency room and at that time had blood work that showed a mild elevated lipase, alk phos of 140 and minimal elevation of AST.  She had a CT scan that showed a large stool burden but no other significant findings.  She did use MiraLAX and had large bowel movements over the last few days.  She however has continued to have fever up to 101 daily.  She denies any diarrhea at this time.  She has had no vomiting.  However she continues to have abdominal pain is worse at night when she lays down.  She saw gastroenterology today and the PA did recurrent blood work and called her on her way home and told her her alkaline phosphatase was in the 200s and she could not wait till Monday to have the evaluation of her gallbladder and she should go to the nearest emergency room.  Patient reports that she has been drinking liquids but not been eating much of anything due to anorexia.  She has had no recent travel and no known foodborne exposures.  Other members in her house are well.  No changes in medication and he she mostly takes vitamins.  The history is provided by the patient.  Abdominal Pain Pain location:  Epigastric, LUQ and RUQ Pain quality: aching, cramping and gnawing   Pain radiates to:  Does not radiate Pain severity:  Moderate Onset quality:  Sudden Duration:  1 week Timing:  Constant Progression since onset: gradually improving. 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    HYSTEROSCOPY  2001   TONSILLECTOMY  1962   WISDOM TOOTH EXTRACTION       OB History     Gravida  2   Para  2   Term      Preterm      AB      Living  2      SAB      IAB      Ectopic      Multiple      Live Births              Family History  Problem Relation Age of Onset   Stroke Father    Early death Father    Hypertension Brother    Hypercholesterolemia Brother    Alcohol abuse Brother    Breast cancer Paternal Grandmother    Cancer Paternal   Grandfather        Uncertain type- Li fraumeni gene   Colon cancer Neg Hx    Esophageal cancer Neg Hx    Rectal cancer Neg Hx    Stomach cancer Neg Hx     Social History   Tobacco Use   Smoking status: Never   Smokeless tobacco: Never  Vaping Use   Vaping Use: Never used  Substance Use Topics   Alcohol use: No   Drug use: No    Home Medications Prior to Admission medications   Medication Sig Start Date End Date Taking? Authorizing Provider  calcium carbonate (CALCIUM 600) 600 MG TABS tablet Take 600 mg by mouth 2 (two) times daily with a meal.    [provider]  Cetirizine HCl (ZYRTEC PO) Take by mouth.    [provider]  Cholecalciferol (VITAMIN D) 50 MCG (2000 UT) CAPS Take 1 capsule by mouth daily.    [provider]  Coenzyme Q10 (COQ10) 100 MG CAPS Take 1 capsule by mouth daily.    [provider]  diphenhydrAMINE HCl (NERVINE PO) Take by mouth.    [provider]  estradiol (ESTRACE)  0.1 MG/GM vaginal cream Place 1 Applicatorful vaginally once a week.    [provider]  MAGNESIUM PO Take 500 mg by mouth daily.     [provider]  Omega-3 Fatty Acids (FISH OIL) 1000 MG CAPS Take 1,000 mg by mouth daily.    [provider]  ondansetron (ZOFRAN ODT) 4 MG disintegrating tablet Take 1 tablet (4 mg total) by mouth every 8 (eight) hours as needed for nausea or vomiting. 03/07/21   Tacy Learn, PA-C  vitamin B-12 (CYANOCOBALAMIN) 250 MCG tablet Take 250 mcg by mouth daily.    [provider]  vitamin E 400 UNIT capsule Take 400 Units by mouth daily.    [provider]    Allergies    Codeine  Review of Systems   Review of Systems  Gastrointestinal:  Positive for abdominal pain.  All other systems reviewed and are negative.  Physical Exam Updated Vital Signs BP 132/74 (BP Location: Right Arm)   Pulse 91   Temp 98.1 F (36.7 C)   Resp 16   Ht 5' 8" (1.727 m)   Wt 70.8 kg   SpO2 99%   BMI 23.72 kg/m   Physical Exam Vitals and nursing note reviewed.  Constitutional:      General: She is not in acute distress.    Appearance: Normal appearance. She is well-developed.  HENT:     Head: Normocephalic and atraumatic.     Mouth/Throat:     Mouth: Mucous membranes are moist.  Eyes:     Pupils: Pupils are equal, round, and reactive to light.  Cardiovascular:     Rate and Rhythm: Normal rate and regular rhythm.     Pulses: Normal pulses.     Heart sounds: Normal heart sounds. No murmur heard.   No friction rub.  Pulmonary:     Effort: Pulmonary effort is normal.     Breath sounds: Normal breath sounds. No wheezing or rales.  Abdominal:     General: Abdomen is flat. Bowel sounds are normal. There is no distension.     Palpations: Abdomen is soft.     Tenderness: There is abdominal tenderness in the epigastric area and left upper quadrant. There is no guarding or rebound.  Musculoskeletal:        General: No  tenderness. Normal range of motion.     Cervical back: Normal range of motion and neck supple.     Comments: No edema  Skin:    General: Skin is warm and dry.     Findings: No rash.  Neurological:     Mental Status: She is alert and oriented to person, place, and time. Mental status is at baseline.     Cranial Nerves: No cranial nerve deficit.  Psychiatric:        Mood and Affect: Mood normal.        Behavior: Behavior normal.    ED Results / Procedures / Treatments   Labs (all labs ordered are listed, but only abnormal results are displayed) Labs Reviewed  COMPREHENSIVE METABOLIC PANEL - Abnormal; Notable for the following components:      Result Value   Calcium 8.5 (*)    Alkaline Phosphatase 235 (*)    All other components within normal limits  LIPASE, BLOOD - Abnormal; Notable for the following components:   Lipase 69 (*)    All other components within normal limits  CBC WITH DIFFERENTIAL/PLATELET - Abnormal; Notable for the following components:   Hemoglobin 8.7 (*)    HCT 27.9 (*)    MCV 71.4 (*)    MCH 22.3 (*)    RDW 19.0 (*)    All other components within normal limits  HEPATITIS PANEL, ACUTE    EKG None  Radiology US Abdomen Limited RUQ (LIVER/GB)  Result Date: 03/10/2021 CLINICAL DATA:  Elevated lipase EXAM: ULTRASOUND ABDOMEN LIMITED RIGHT UPPER QUADRANT COMPARISON:  03/07/2021 CT FINDINGS: Gallbladder: No gallstones or wall thickening visualized. No sonographic Murphy sign noted by sonographer. Common bile duct: Diameter: 6 mm Liver: No focal lesion identified. Within normal limits in parenchymal echogenicity. Portal vein is patent on color Doppler imaging with normal direction of blood flow towards the liver. Other: None. IMPRESSION: Normal right upper quadrant ultrasound. Electronically Signed   By: Mark  Lukens M.D.   On: 03/10/2021 20:30    Procedures Procedures   Medications Ordered in ED Medications  lactated ringers bolus 1,000 mL (1,000 mLs  Intravenous New Bag/Given 03/10/21 2249)    ED Course  I have reviewed the triage vital signs and the nursing notes.  Pertinent labs & imaging results that were available during my care of the patient were reviewed by me and considered in my medical decision making (see chart for details).    MDM Rules/Calculators/A&P                           Patient returning to the emergency room today due to request of GI.  Her alkaline phosphatase has gone up however unclear if any other labs were abnormal was they are not available in our system.  Patient has persistent symptoms of upper abdominal pain and fever daily.  She is currently afebrile here with normal vital signs.  She does have discomfort in the epigastric and left upper quadrant area but CAT scan from 3 days ago showed no acute findings.  We will recheck labs to evaluate for worsening lipase, worsening AST/ALT, elevated bilirubin.  Hepatitis panel was sent.  Ultrasound was ordered to rule out evidence for choledocholithiasis or ascending cholangitis.  Ultrasound was normal today.  Labs are pending and patient checked out to Dr. Pollina  MDM   Amount and/or Complexity of Data Reviewed Tests in the radiology section of CPT: ordered and reviewed Independent visualization of   images, tracings, or specimens: yes  Patient Progress Patient progress: stable   Final Clinical Impression(s) / ED Diagnoses Final diagnoses:  RUQ abdominal pain  Elevated lipase    Rx / DC Orders ED Discharge Orders     None        Plunkett, [place], MD 03/10/21 2352  

## 2021-03-11 LAB — HEPATITIS PANEL, ACUTE
HCV Ab: NONREACTIVE
Hep A IgM: NONREACTIVE
Hep B C IgM: NONREACTIVE
Hepatitis B Surface Ag: NONREACTIVE

## 2021-03-11 NOTE — ED Provider Notes (Signed)
Signed out by Dr. Maryan Rued.  Patient awaiting labs.  Patient referred to the ER by GI because of elevated alkaline phosphatase.  Patient was seen in the ED 3 days ago and had an elevated lipase, alk phos and was seen to be moderately constipated by CT scan.  She has done bowel prep and is feeling better but still is running some fevers.  Her doctor could not see her in the office.  GI sent her to the ED, presumably to rule out cholangitis.  She does have an elevated alk phos but total bilirubin, AST, ALT are normal.  Right upper quadrant ultrasound is normal.  Patient does appear well.  She is afebrile with normal vital signs.  Suspect all of the lab abnormalities are secondary to the constipation.  Recommend she follow-up with GI.   Orpah Greek, MD 03/11/21 0100

## 2021-03-13 ENCOUNTER — Other Ambulatory Visit: Payer: Medicare Other

## 2021-03-14 ENCOUNTER — Encounter: Payer: Self-pay | Admitting: Family Medicine

## 2021-03-14 ENCOUNTER — Other Ambulatory Visit: Payer: Self-pay

## 2021-03-14 ENCOUNTER — Ambulatory Visit (INDEPENDENT_AMBULATORY_CARE_PROVIDER_SITE_OTHER): Payer: Medicare Other | Admitting: Family Medicine

## 2021-03-14 VITALS — BP 105/68 | HR 92 | Temp 98.0°F | Wt 156.0 lb

## 2021-03-14 DIAGNOSIS — W57XXXA Bitten or stung by nonvenomous insect and other nonvenomous arthropods, initial encounter: Secondary | ICD-10-CM

## 2021-03-14 DIAGNOSIS — R5383 Other fatigue: Secondary | ICD-10-CM | POA: Diagnosis not present

## 2021-03-14 DIAGNOSIS — R0602 Shortness of breath: Secondary | ICD-10-CM

## 2021-03-14 DIAGNOSIS — Z87828 Personal history of other (healed) physical injury and trauma: Secondary | ICD-10-CM

## 2021-03-14 DIAGNOSIS — Z0000 Encounter for general adult medical examination without abnormal findings: Secondary | ICD-10-CM

## 2021-03-14 DIAGNOSIS — R748 Abnormal levels of other serum enzymes: Secondary | ICD-10-CM | POA: Diagnosis not present

## 2021-03-14 DIAGNOSIS — D649 Anemia, unspecified: Secondary | ICD-10-CM

## 2021-03-14 LAB — COMPREHENSIVE METABOLIC PANEL
ALT: 30 U/L (ref 0–35)
AST: 33 U/L (ref 0–37)
Albumin: 3.8 g/dL (ref 3.5–5.2)
Alkaline Phosphatase: 254 U/L — ABNORMAL HIGH (ref 39–117)
BUN: 9 mg/dL (ref 6–23)
CO2: 27 mEq/L (ref 19–32)
Calcium: 9.4 mg/dL (ref 8.4–10.5)
Chloride: 103 mEq/L (ref 96–112)
Creatinine, Ser: 0.8 mg/dL (ref 0.40–1.20)
GFR: 75.95 mL/min (ref >60.00)
Glucose, Bld: 78 mg/dL (ref 70–99)
Potassium: 4.9 mEq/L (ref 3.5–5.1)
Sodium: 139 mEq/L (ref 135–145)
Total Bilirubin: 0.4 mg/dL (ref 0.2–1.2)
Total Protein: 7.1 g/dL (ref 6.0–8.3)

## 2021-03-14 LAB — CBC WITH DIFFERENTIAL/PLATELET
Basophils Absolute: 0.1 10*3/uL (ref 0.0–0.1)
Basophils Relative: 1.6 % (ref 0.0–3.0)
Eosinophils Absolute: 0.1 10*3/uL (ref 0.0–0.7)
Eosinophils Relative: 0.9 % (ref 0.0–5.0)
HCT: 31.1 % — ABNORMAL LOW (ref 36.0–46.0)
Hemoglobin: 9.7 g/dL — ABNORMAL LOW (ref 12.0–15.0)
Lymphocytes Relative: 24.8 % (ref 12.0–46.0)
Lymphs Abs: 1.8 10*3/uL (ref 0.7–4.0)
MCHC: 31.2 g/dL (ref 30.0–36.0)
MCV: 70.5 fl — ABNORMAL LOW (ref 78.0–100.0)
Monocytes Absolute: 0.8 10*3/uL (ref 0.1–1.0)
Monocytes Relative: 10.8 % (ref 3.0–12.0)
Neutro Abs: 4.5 10*3/uL (ref 1.4–7.7)
Neutrophils Relative %: 61.9 % (ref 43.0–77.0)
Platelets: 384 10*3/uL (ref 150.0–400.0)
RBC: 4.4 Mil/uL (ref 3.87–5.11)
RDW: 19.4 % — ABNORMAL HIGH (ref 11.5–15.5)
WBC: 7.3 10*3/uL (ref 4.0–10.5)

## 2021-03-14 LAB — TSH: TSH: 1.87 u[IU]/mL (ref 0.35–5.50)

## 2021-03-14 LAB — C-REACTIVE PROTEIN: CRP: 5.3 mg/dL (ref 0.5–20.0)

## 2021-03-14 NOTE — Progress Notes (Signed)
This visit occurred during the SARS-CoV-2 public health emergency.  Safety protocols were in place, including screening questions prior to the visit, additional usage of staff PPE, and extensive cleaning of exam room while observing appropriate contact time as indicated for disinfecting solutions.    Christy Kelly , October 15, 1950, 70 y.o., female MRN: [id number] Patient Care Team    Relationship Specialty Notifications Start End  Ma Hillock, DO PCP - General Family Medicine  03/14/21   Dian Queen, MD Consulting Physician Obstetrics and Gynecology  02/16/19   Satira Sark, MD Consulting Physician Cardiology  04/07/19   Penni Bombard, MD Consulting Physician Neurology  04/07/19     Chief Complaint  Patient presents with   Fatigue    Pt c/o SOB, fatigue, HA , low grade temp, chills x 1 mo; pt was recently seen in ED for GI sx (resolved)     Subjective: Pt presents for an OV with complaints of fatigue, shortness of breath, headache that has been occurring over the last month.  She describes the headache as a dull ache in which she was needing to take Tylenol a few days in a row.  She reports she is getting short of breath with activity even a short walk on an incline had her winded and her muscles feeling very fatigued.  She has had a low-grade temp and chills intermittently.  She reports she had lower extremities swelling for a couple of days, just very mild, but new for her.  She reports she did have a tick bite a few months ago.  She denies current nausea, vomiting, diarrhea or constipation.  She reports she had a bowel movement this morning and it was normal.  She denies thunderclap headache or worsening headache.  She had noticed some neck tension over the last few days. She has been seen in the emergency room twice for right upper abdominal pain and elevated liver enzymes.  She had increasing levels of alk phos.  CT abdomen did not result with acute process that would be  causing her pain or symptoms.  It did show constipation and mild diverticular disease.  Ultrasound of right upper quadrant unremarkable-common bile duct at the upper limits of normal for a female at 6 mm. Lipase initially 192, trending down to 69. AST just mildly elevated at 44, trended down to normal at 37. Alk phos elevated of 148, trended up to 235. Mild hypocalcemia present. Her baseline hemoglobin levels are around 12 .  Hemoglobin in the ED was 9.9, then trended down to 8.7 within 3 days.  Hematocrit was 32.3 and trended down to 27.9. WBCs are normal and differential is normal.  She denies any alcohol use.  She only uses an occasional Aleve, and nothing routine prior to onset of symptoms.  She was using Tylenol for the headaches, but has since stopped.  She denies any Goody powders or aspirin use.  She denies any dark stools, pale stools or bloody stools.  She does not have a prior history of gallbladder stones.  She had a normal colonoscopy in 2015 with the exception of very mild diverticular disease and an internal hemorrhoid.  There is no family history of colon cancers or irritable bowel disease.   Depression screen [facility] 2/9 05/26/2020 04/07/2019 02/13/2019  Decreased Interest 0 0 0  Down, Depressed, Hopeless 0 0 0  PHQ - 2 Score 0 0 0    Allergies  Allergen Reactions   Codeine Itching and  Nausea And Vomiting   Social History   Social History Narrative   Marital status/children/pets: married, lives with husband.    Education/employment: 1 year of college-retired.  Worked as a Information systems manager.   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   Past Surgical  History:  Procedure Laterality Date   BUNIONECTOMY     x2 about 2012 bilateral.    HYSTEROSCOPY  2001   TONSILLECTOMY  1962   WISDOM TOOTH EXTRACTION     Family History  Problem Relation Age of Onset   Stroke Father    Early death Father    Hypertension Brother    Hypercholesterolemia Brother    Alcohol abuse Brother    Breast cancer Paternal Grandmother    Cancer Paternal Grandfather        Uncertain type- Li fraumeni gene   Colon cancer Neg Hx    Esophageal cancer Neg Hx    Rectal cancer Neg Hx    Stomach cancer Neg Hx    Allergies as of 03/14/2021       Reactions   Codeine Itching, Nausea And Vomiting        Medication List        Accurate as of March 14, 2021 11:18 AM. If you have any questions, ask your nurse or doctor.          STOP taking these medications    estradiol 0.1 MG/GM vaginal cream Commonly known as: ESTRACE Stopped by: Howard Pouch, DO   NERVINE PO Stopped by: Howard Pouch, DO       TAKE these medications    calcium carbonate 600 MG Tabs tablet Commonly known as: OS-CAL Take 600 mg by mouth 2 (two) times daily with a meal.   CoQ10 100 MG Caps Take 1 capsule by mouth daily.   Fish Oil 1000 MG Caps Take 1,000 mg by mouth daily.   MAGNESIUM PO Take 500 mg by mouth daily.   ondansetron 4 MG disintegrating tablet Commonly known as: Zofran ODT Take 1 tablet (4 mg total) by mouth every 8 (eight) hours as needed for nausea or vomiting.   vitamin B-12 250 MCG tablet Commonly known as: CYANOCOBALAMIN Take 250 mcg by mouth daily.   Vitamin D 50 MCG (2000 UT) Caps Take 1 capsule by mouth daily.   vitamin E 180 MG (400 UNITS) capsule Take 400 Units by mouth daily.   ZYRTEC PO Take by mouth.        All past medical history, surgical history, allergies, family history, immunizations andmedications were updated in the EMR today and reviewed under the history and medication portions of their EMR.     ROS: Negative, with the  exception of above mentioned in HPI   Objective:  BP 105/68   Pulse 92   Temp 98 F (36.7 C) (Oral)   Wt 156 lb (70.8 kg)   SpO2 98%   BMI 23.72 kg/m  Body mass index is 23.72 kg/m. Gen: Afebrile. No acute distress. Nontoxic in appearance, well developed, well nourished.  Very pleasant female. HENT: AT. [place].  MMM, no oral lesions.  No cough.  No hoarseness. Eyes:Pupils Equal Round Reactive to light, Extraocular movements intact,  Conjunctiva without redness, discharge or icterus. Neck/lymp/endocrine: Supple, no lymphadenopathy, no thyromegaly CV: RRR no murmur, no edema Chest: CTAB, no wheeze or crackles. Good air movement, normal resp effort.  Abd: Soft.  Flat.ND.  Mild tenderness present right upper quadrant.  Positive Murphy's.  BS present.  No masses palpated. No rebound or guarding.  Skin: No rashes, purpura or petechiae.  Neuro:  Normal gait. PERLA. EOMi. Alert. Oriented x3  Psych: Normal affect, dress and demeanor. Normal speech. Normal thought content and judgment.  No results found. No results found. No results found for this or any previous visit (from the past 24 hour(s)).  Assessment/Plan: Christy Kelly is a 70 y.o. female present for OV for  Anemia/fatigue/shortness of breath VSS. Suspect pain to be either duodenitis or she passed a gallbladder stone.  She is mildly tender over her gallbladder and duodenum  areas by exam today.  Suspect her shortness of breath, fatigue is related to newfound anemia and likely iron depleted-?  Ulceration.  We will check these levels today to trend and likely need to at least consider iron supplementation.  We will wait upon labs before initiating.  I have also instructed patient on Hemoccult home testing. - CBC with Differential/Platelet - Iron, TIBC and Ferritin Panel - C-reactive protein - POC Hemoccult Bld/Stl (3-Cd Home Screen); Future - TSH - B. burgdorfi antibodies  Elevated alkaline phosphatase level/Hypocalcemia - Comp Met  (CMET)  Tick bite, unspecified site, initial encounter - B. burgdorfi antibodies-do not strongly suspect as cause.  But will test to rule out.  Reviewed expectations re: course of current medical issues. Discussed self-management of symptoms. Outlined signs and symptoms indicating need for more acute intervention. Patient verbalized understanding and all questions were answered. Patient received an After-Visit Summary.    Orders Placed This Encounter  Procedures   CBC with Differential/Platelet   Iron, TIBC and Ferritin Panel   TSH   C-reactive protein   Comp Met (CMET)   B. burgdorfi antibodies   POC Hemoccult Bld/Stl (3-Cd Home Screen)   No orders of the defined types were placed in this encounter.  Referral Orders  No referral(s) requested today     Note is dictated utilizing voice recognition software. Although note has been proof read prior to signing, occasional typographical errors still can be missed. If any questions arise, please do not hesitate to call for verification.   electronically signed by:  Howard Pouch, DO  [place]

## 2021-03-15 ENCOUNTER — Telehealth: Payer: Self-pay | Admitting: Family Medicine

## 2021-03-15 MED ORDER — POLYSACCHARIDE IRON COMPLEX 150 MG PO CAPS
150.0000 mg | ORAL_CAPSULE | Freq: Every day | ORAL | 1 refills | Status: DC
Start: 2021-03-15 — End: 2021-05-25

## 2021-03-15 NOTE — Telephone Encounter (Signed)
As expected her iron panel is low.  This is why she is having the fatigue and the shortness of breath. I have called in iron polysaccharide 150 mg daily for her.  If this is expensive or not covered by her insurance have her look it up on [place], it is not expensive if purchased over-the-counter.  I do recommend she start the Senokot 2 tabs before bed, especially while taking the iron.  Although this iron formulation is well-tolerated in most people, all iron supplements can cause some people constipation and she already struggles in that area.  Follow-up in 6 weeks for recheck.-Sooner if symptoms are worsening instead of improving.

## 2021-03-15 NOTE — Telephone Encounter (Signed)
Spoke with pt regarding labs and instructions. Pt sched for f/u

## 2021-03-16 ENCOUNTER — Other Ambulatory Visit: Payer: Self-pay | Admitting: Physician Assistant

## 2021-03-16 DIAGNOSIS — R945 Abnormal results of liver function studies: Secondary | ICD-10-CM

## 2021-03-16 DIAGNOSIS — R748 Abnormal levels of other serum enzymes: Secondary | ICD-10-CM

## 2021-03-16 DIAGNOSIS — R7989 Other specified abnormal findings of blood chemistry: Secondary | ICD-10-CM

## 2021-03-17 ENCOUNTER — Other Ambulatory Visit: Payer: Self-pay

## 2021-03-17 ENCOUNTER — Telehealth: Payer: Self-pay | Admitting: Family Medicine

## 2021-03-17 ENCOUNTER — Ambulatory Visit (INDEPENDENT_AMBULATORY_CARE_PROVIDER_SITE_OTHER): Payer: Medicare Other

## 2021-03-17 DIAGNOSIS — D649 Anemia, unspecified: Secondary | ICD-10-CM

## 2021-03-17 LAB — POC HEMOCCULT BLD/STL (HOME/3-CARD/SCREEN)
Card #2 Fecal Occult Blod, POC: POSITIVE
Card #3 Fecal Occult Blood, POC: POSITIVE
Fecal Occult Blood, POC: POSITIVE — AB

## 2021-03-17 MED ORDER — DOXYCYCLINE HYCLATE 100 MG PO TABS: 100.0000 mg | ORAL_TABLET | Freq: Two times a day (BID) | ORAL | 0 refills | Status: DC

## 2021-03-17 NOTE — Telephone Encounter (Signed)
Please send recent labs including FOBT to her GI. thanks

## 2021-03-17 NOTE — Telephone Encounter (Signed)
Spoke with pt regarding labs and instructions. Pt is seeing Verdie Shire Pa with [facility] for her gastroenterologist.

## 2021-03-17 NOTE — Telephone Encounter (Signed)
Call patient and inform her her Hemoccult cards were all positive.   She also has a positive Lyme disease titer.  This is just the first step and is not considered positive and last it is verified with a more specific test.  The more specific test is being ran as we speak, but has yet to return and I do not want to wait the weekend or more before initiating treatment if it is positive for Lyme.  Therefore I have called in the medication called doxycycline which is taken every 12 hours.  I have called in 10 days worth of this medication.  I would like her to start as soon as possible.  Take with at least a small amount of food on her stomach or will make nauseous.  Once we get the confirmatory results back we will call her again and provide her with more information.  If those confirmatory results are negative then we can discontinue the doxycycline at that time.  With her Hemoccult cards positive I do want to get information to her gastroenterologist, please ask her who she is seeing-I know she has a MR CP study in a few days.

## 2021-03-17 NOTE — Addendum Note (Signed)
Addended by: Beryle Lathe S on: 03/17/2021 11:30 AM   Modules accepted: Orders

## 2021-03-17 NOTE — Progress Notes (Signed)
Per the orders of Dr. Raoul Pitch pt is here to drop off specimen for analysis.

## 2021-03-17 NOTE — Addendum Note (Signed)
Addended by: Beryle Lathe S on: 03/17/2021 11:40 AM   Modules accepted: Orders

## 2021-03-20 ENCOUNTER — Ambulatory Visit
Admission: RE | Admit: 2021-03-20 | Discharge: 2021-03-20 | Disposition: A | Discharge status: Home / Self Care | Payer: Medicare Other | Source: Ambulatory Visit | Attending: Physician Assistant | Admitting: Physician Assistant

## 2021-03-20 ENCOUNTER — Other Ambulatory Visit: Payer: Self-pay

## 2021-03-20 DIAGNOSIS — R7989 Other specified abnormal findings of blood chemistry: Secondary | ICD-10-CM

## 2021-03-20 DIAGNOSIS — R748 Abnormal levels of other serum enzymes: Secondary | ICD-10-CM

## 2021-03-20 DIAGNOSIS — R945 Abnormal results of liver function studies: Secondary | ICD-10-CM

## 2021-03-20 LAB — POC HEMOCCULT BLD/STL (HOME/3-CARD/SCREEN)
Card #2 Fecal Occult Blod, POC: NEGATIVE
Card #3 Fecal Occult Blood, POC: NEGATIVE
Fecal Occult Blood, POC: NEGATIVE

## 2021-03-20 MED ORDER — GADOBENATE DIMEGLUMINE 529 MG/ML IV SOLN
14.0000 mL | Freq: Once | INTRAVENOUS | Status: AC | PRN
  Administered 2021-03-20: 14 mL via INTRAVENOUS

## 2021-03-20 NOTE — Progress Notes (Signed)
Please disregard resulted hemoccult card on 03/20/21.

## 2021-03-20 NOTE — Telephone Encounter (Signed)
Results routed to GI

## 2021-03-20 NOTE — Addendum Note (Signed)
Addended by: Octaviano Glow on: 03/20/2021 08:54 AM   Modules accepted: Orders

## 2021-03-21 LAB — IRON,TIBC AND FERRITIN PANEL
%SAT: 6 % (calc) — ABNORMAL LOW (ref 16–45)
Ferritin: 36 ng/mL (ref 16–288)
Iron: 22 ug/dL — ABNORMAL LOW (ref 45–160)
TIBC: 381 mcg/dL (calc) (ref 250–450)

## 2021-03-21 LAB — B. BURGDORFI ANTIBODIES BY WB
B burgdorferi IgG Abs (IB): NEGATIVE
B burgdorferi IgM Abs (IB): NEGATIVE
Lyme Disease 18 kD IgG: NONREACTIVE
Lyme Disease 23 kD IgG: NONREACTIVE
Lyme Disease 23 kD IgM: REACTIVE — AB
Lyme Disease 28 kD IgG: NONREACTIVE
Lyme Disease 30 kD IgG: NONREACTIVE
Lyme Disease 39 kD IgG: NONREACTIVE
Lyme Disease 39 kD IgM: NONREACTIVE
Lyme Disease 41 kD IgG: REACTIVE — AB
Lyme Disease 41 kD IgM: NONREACTIVE
Lyme Disease 45 kD IgG: NONREACTIVE
Lyme Disease 58 kD IgG: NONREACTIVE
Lyme Disease 66 kD IgG: NONREACTIVE
Lyme Disease 93 kD IgG: NONREACTIVE

## 2021-03-21 LAB — LYME AB SCREEN %: Lyme AB Screen: 5.47 index — ABNORMAL HIGH

## 2021-03-27 ENCOUNTER — Telehealth: Payer: Self-pay | Admitting: Family Medicine

## 2021-03-27 MED ORDER — DOXYCYCLINE HYCLATE 100 MG PO TABS: 100.0000 mg | ORAL_TABLET | Freq: Two times a day (BID) | ORAL | 0 refills | Status: DC

## 2021-03-27 NOTE — Telephone Encounter (Signed)
Spoke with pt regarding labs and instructions.   Spoke with Christy Kelly regarding hemoccult cards, she had already sent staff message to provider.

## 2021-03-27 NOTE — Telephone Encounter (Signed)
Please inform pt her lyme panel (2nd test) is borderline.  She has one Igm and 1 IGG positive, this can mean an early lyme disease exposure or false negative. - I would recommend she continue the doxy for a total of 21 days> I have sent in the 2nd script. This is the treatment for someone with positive titers and sx with headache etc.  - We will also need to repeat titers on her follow up appt in September.   I hope she is starting to feel better.   GC: Lastly, can we clarify if the hemoccult test were positive or negative. Please see Lorriane Shire on this matter. Initially read (I dont know by whom) as positive with result in epic and then [place] reported she thought they had not been done and did them again with negative results. ???  Thanks.

## 2021-03-29 DIAGNOSIS — K2940 Chronic atrophic gastritis without bleeding: Secondary | ICD-10-CM | POA: Diagnosis not present

## 2021-03-29 DIAGNOSIS — B9681 Helicobacter pylori [H. pylori] as the cause of diseases classified elsewhere: Secondary | ICD-10-CM | POA: Diagnosis not present

## 2021-03-29 DIAGNOSIS — K31A19 Gastric intestinal metaplasia without dysplasia, unspecified site: Secondary | ICD-10-CM | POA: Diagnosis not present

## 2021-03-29 DIAGNOSIS — K2289 Other specified disease of esophagus: Secondary | ICD-10-CM | POA: Diagnosis not present

## 2021-03-29 DIAGNOSIS — K5710 Diverticulosis of small intestine without perforation or abscess without bleeding: Secondary | ICD-10-CM | POA: Diagnosis not present

## 2021-03-29 DIAGNOSIS — K3189 Other diseases of stomach and duodenum: Secondary | ICD-10-CM | POA: Diagnosis not present

## 2021-03-29 DIAGNOSIS — K31A12 Gastric intestinal metaplasia without dysplasia, involving the body (corpus): Secondary | ICD-10-CM | POA: Diagnosis not present

## 2021-03-29 DIAGNOSIS — K5730 Diverticulosis of large intestine without perforation or abscess without bleeding: Secondary | ICD-10-CM | POA: Diagnosis not present

## 2021-03-29 DIAGNOSIS — K259 Gastric ulcer, unspecified as acute or chronic, without hemorrhage or perforation: Secondary | ICD-10-CM | POA: Diagnosis not present

## 2021-03-29 DIAGNOSIS — D509 Iron deficiency anemia, unspecified: Secondary | ICD-10-CM | POA: Diagnosis not present

## 2021-03-29 DIAGNOSIS — K2930 Chronic superficial gastritis without bleeding: Secondary | ICD-10-CM | POA: Diagnosis not present

## 2021-03-29 DIAGNOSIS — K219 Gastro-esophageal reflux disease without esophagitis: Secondary | ICD-10-CM | POA: Diagnosis not present

## 2021-03-29 DIAGNOSIS — D122 Benign neoplasm of ascending colon: Secondary | ICD-10-CM | POA: Diagnosis not present

## 2021-03-29 HISTORY — PX: UPPER GI ENDOSCOPY: SHX6162

## 2021-03-29 HISTORY — PX: COLONOSCOPY: SHX174

## 2021-03-29 LAB — HM COLONOSCOPY

## 2021-03-30 ENCOUNTER — Ambulatory Visit: Payer: Medicare Other | Admitting: Physician Assistant

## 2021-04-04 DIAGNOSIS — K31A19 Gastric intestinal metaplasia without dysplasia, unspecified site: Secondary | ICD-10-CM | POA: Diagnosis not present

## 2021-04-04 DIAGNOSIS — K2940 Chronic atrophic gastritis without bleeding: Secondary | ICD-10-CM | POA: Diagnosis not present

## 2021-04-04 DIAGNOSIS — K2930 Chronic superficial gastritis without bleeding: Secondary | ICD-10-CM | POA: Diagnosis not present

## 2021-04-04 DIAGNOSIS — B9681 Helicobacter pylori [H. pylori] as the cause of diseases classified elsewhere: Secondary | ICD-10-CM | POA: Diagnosis not present

## 2021-04-04 DIAGNOSIS — K219 Gastro-esophageal reflux disease without esophagitis: Secondary | ICD-10-CM | POA: Diagnosis not present

## 2021-04-04 DIAGNOSIS — D122 Benign neoplasm of ascending colon: Secondary | ICD-10-CM | POA: Diagnosis not present

## 2021-04-05 ENCOUNTER — Ambulatory Visit: Payer: Medicare Other | Admitting: Physician Assistant

## 2021-04-24 ENCOUNTER — Ambulatory Visit: Payer: Medicare Other | Admitting: Family Medicine

## 2021-04-28 ENCOUNTER — Encounter: Payer: Self-pay | Admitting: Family Medicine

## 2021-04-28 ENCOUNTER — Other Ambulatory Visit: Payer: Self-pay

## 2021-04-28 ENCOUNTER — Ambulatory Visit (INDEPENDENT_AMBULATORY_CARE_PROVIDER_SITE_OTHER): Payer: Medicare Other | Admitting: Family Medicine

## 2021-04-28 VITALS — BP 94/61 | HR 86 | Temp 98.1°F | Ht 69.0 in | Wt 155.0 lb

## 2021-04-28 DIAGNOSIS — R768 Other specified abnormal immunological findings in serum: Secondary | ICD-10-CM | POA: Diagnosis not present

## 2021-04-28 DIAGNOSIS — D500 Iron deficiency anemia secondary to blood loss (chronic): Secondary | ICD-10-CM

## 2021-04-28 DIAGNOSIS — K279 Peptic ulcer, site unspecified, unspecified as acute or chronic, without hemorrhage or perforation: Secondary | ICD-10-CM

## 2021-04-28 DIAGNOSIS — T732XXS Exhaustion due to exposure, sequela: Secondary | ICD-10-CM

## 2021-04-28 DIAGNOSIS — R748 Abnormal levels of other serum enzymes: Secondary | ICD-10-CM | POA: Diagnosis not present

## 2021-04-28 DIAGNOSIS — T732XXA Exhaustion due to exposure, initial encounter: Secondary | ICD-10-CM | POA: Insufficient documentation

## 2021-04-28 NOTE — Progress Notes (Signed)
This visit occurred during the SARS-CoV-2 public health emergency.  Safety protocols were in place, including screening questions prior to the visit, additional usage of staff PPE, and extensive cleaning of exam room while observing appropriate contact time as indicated for disinfecting solutions.    Christy Kelly , [birth year], 70 y.o., female MRN: [id number] Patient Care Team    Relationship Specialty Notifications Start End  Ma Hillock, DO PCP - General Family Medicine  03/14/21   Dian Queen, MD Consulting Physician Obstetrics and Gynecology  02/16/19   Satira Sark, MD Consulting Physician Cardiology  04/07/19   Penni Bombard, MD Consulting Physician Neurology  04/07/19   Ronnette Juniper, MD Consulting Physician Gastroenterology  03/14/21     Chief Complaint  Patient presents with   iron def    Pt is not fasting     Subjective: Pt presents for an OV to follow-up on her iron deficiency anemia and fatigue.  She has now been seen by [facility] gastroenterology.  She underwent ERCP, EGD and colonoscopy which resulted with peptic ulcers, positive H. pylori and colon polyps.  She was treated with clarithromycin x10 days.  She reports the stomach pain has improved since starting Protonix twice daily.  She still occasionally will have the gnawing pain if she eats meat.  She is feeling improved from the fatigue standpoint.  Still can be mildly short of breath if going up steps.  However she feels much improved.  She is tolerating the iron daily.  She is also taking Florastor probiotic.  She had a positive Lyme antibody and equivocal Western blot.  She was treated appropriately with doxycycline x21 days.  Prior note: with complaints of fatigue, shortness of breath, headache that has been occurring over the last month.  She describes the headache as a dull ache in which she was needing to take Tylenol a few days in a row.  She reports she is getting short of breath with activity even a  short walk on an incline had her winded and her muscles feeling very fatigued.  She has had a low-grade temp and chills intermittently.  She reports she had lower extremities swelling for a couple of days, just very mild, but new for her.  She reports she did have a tick bite a few months ago.  She denies current nausea, vomiting, diarrhea or constipation.  She reports she had a bowel movement this morning and it was normal.  She denies thunderclap headache or worsening headache.  She had noticed some neck tension over the last few days. She has been seen in the emergency room twice for right upper abdominal pain and elevated liver enzymes.  She had increasing levels of alk phos.  CT abdomen did not result with acute process that would be causing her pain or symptoms.  It did show constipation and mild diverticular disease.  Ultrasound of right upper quadrant unremarkable-common bile duct at the upper limits of normal for a female at 6 mm. Lipase initially 192, trending down to 69. AST just mildly elevated at 44, trended down to normal at 37. Alk phos elevated of 148, trended up to 235. Mild hypocalcemia present. Her baseline hemoglobin levels are around 12 .  Hemoglobin in the ED was 9.9, then trended down to 8.7 within 3 days.  Hematocrit was 32.3 and trended down to 27.9. WBCs are normal and differential is normal.  She denies any alcohol use.  She only uses an occasional Aleve,  and nothing routine prior to onset of symptoms.  She was using Tylenol for the headaches, but has since stopped.  She denies any Goody powders or aspirin use.  She denies any dark stools, pale stools or bloody stools.  She does not have a prior history of gallbladder stones.  She had a normal colonoscopy in 2015 with the exception of very mild diverticular disease and an internal hemorrhoid.  There is no family history of colon cancers or irritable bowel disease.   Depression screen [facility] 2/9 05/26/2020 04/07/2019 02/13/2019   Decreased Interest 0 0 0  Down, Depressed, Hopeless 0 0 0  PHQ - 2 Score 0 0 0    Allergies  Allergen Reactions   Codeine Itching and Nausea And Vomiting   Social History   Social History Narrative   Marital status/children/pets: married, lives with husband.    Education/employment: 1 year of college-retired.  Worked as a Information systems manager.   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   Past Surgical History:  Procedure Laterality Date   BUNIONECTOMY     x2 about 2012 bilateral.    COLONOSCOPY  03/29/2021   Attending MD: Ronnette Juniper, MD; Location: [facility] ENDOSCOPY  03/29/2021   Attending MD: Ronnette Juniper, MD; Location: [facility] EXTRACTION     Family History  Problem Relation Age of Onset   Stroke Father    Early death Father    Hypertension Brother    Hypercholesterolemia Brother    Alcohol abuse Brother    Breast cancer Paternal Grandmother    Cancer Paternal Grandfather        Uncertain type- Li fraumeni gene   Colon cancer Neg Hx    Esophageal cancer Neg Hx    Rectal cancer Neg Hx    Stomach cancer Neg Hx    Allergies as of 04/28/2021       Reactions   Codeine Itching, Nausea And Vomiting        Medication List        Accurate as of April 28, 2021  1:33 PM. If you have any questions, ask your nurse or doctor.          calcium carbonate 600 MG Tabs tablet Commonly known as: OS-CAL Take 600 mg by mouth 2 (two) times daily with a meal.   CoQ10 100 MG Caps Take 1 capsule by mouth daily.   doxycycline 100 MG tablet Commonly  known as: VIBRA-TABS Take 1 tablet (100 mg total) by mouth 2 (two) times daily.   Fish Oil 1000 MG Caps Take 1,000 mg by mouth daily.   iron polysaccharides 150 MG capsule Commonly known as: NIFEREX Take 1 capsule (150 mg total) by mouth daily.   MAGNESIUM PO Take 500 mg by mouth daily.   ondansetron 4 MG disintegrating tablet Commonly known as: Zofran ODT Take 1 tablet (4 mg total) by mouth every 8 (eight) hours as needed  for nausea or vomiting.   pantoprazole 40 MG tablet Commonly known as: PROTONIX Take 40 mg by mouth 2 (two) times daily.   polyethylene glycol-electrolytes 420 g solution Commonly known as: NuLYTELY See admin instructions.   vitamin B-12 250 MCG tablet Commonly known as: CYANOCOBALAMIN Take 250 mcg by mouth daily.   Vitamin D 50 MCG (2000 UT) Caps Take 1 capsule by mouth daily.   vitamin E 180 MG (400 UNITS) capsule Take 400 Units by mouth daily.   ZYRTEC PO Take by mouth.        All past medical history, surgical history, allergies, family history, immunizations andmedications were updated in the EMR today and reviewed under the history and medication portions of their EMR.     ROS: Negative, with the exception of above mentioned in HPI   Objective:  BP 94/61   Pulse 86   Temp 98.1 F (36.7 C) (Oral)   Ht _0  (1.753 m)   Wt 155 lb (70.3 kg)   SpO2 98%   BMI 22.89 kg/m  Body mass index is 22.89 kg/m. Gen: Afebrile. No acute distress.  HENT: AT. [place].  Eyes:Pupils Equal Round Reactive to light, Extraocular movements intact,  Conjunctiva without redness, discharge or icterus. CV: RRR  Chest: CTAB, no wheeze or crackles Skin: no rashes, purpura or petechiae.  Neuro: Normal gait. PERLA. EOMi. Alert. Oriented x3 Psych: Normal affect, dress and demeanor. Normal speech. Normal thought content and judgment.   No results found. No results found. No results found for this or any previous visit (from the past 24  hour(s)).  Assessment/Plan: Christy Kelly is a 70 y.o. female present for OV for  Anemia/fatigue/shortness of breath/PUD/positive H. pylori She is seeing some improvement in her symptoms. -Continue Protonix twice daily-prescribed by GI, but no follow-up scheduled.  She has an appointment here for her physical next month and we will address again and take over prescription if needed. -Continue iron supplement daily for now.  We discussed in length today that she will likely need this at least for a few more months, then hopefully will be able to discontinue for her.  Elevated alk phos: CMP collected today  Tick bite, unspecified site, initial encounter -Her initial Lyme titers were positive and her Western blot was equivocal. Will repeat Western blot today to ensure was not early Lyme disease infection.  She has been treated appropriately with doxycycline x21 days.   Reviewed expectations re: course of current medical issues. Discussed self-management of symptoms. Outlined signs and symptoms indicating need for more acute intervention. Patient verbalized understanding and all questions were answered. Patient received an After-Visit Summary.    No orders of the defined types were placed in this encounter.  No orders of the defined types were placed in this encounter.  Referral Orders  No referral(s) requested today     Note is dictated utilizing voice recognition software. Although note has been proof read prior to signing, occasional typographical errors still can be missed. If any questions arise, please do not hesitate to call for verification.   electronically signed by:  Howard Pouch, DO  [place]

## 2021-04-28 NOTE — Patient Instructions (Signed)
Great to see you today.  I have refilled the medication(s) we provide.   If labs were collected, we will inform you of lab results once received either by echart message or telephone call.   - echart message- for normal results that have been seen by the patient already.   - telephone call: abnormal results or if patient has not viewed results in their echart.  

## 2021-05-01 ENCOUNTER — Telehealth: Payer: Self-pay | Admitting: Family Medicine

## 2021-05-01 DIAGNOSIS — R5383 Other fatigue: Secondary | ICD-10-CM

## 2021-05-01 DIAGNOSIS — R0602 Shortness of breath: Secondary | ICD-10-CM

## 2021-05-01 DIAGNOSIS — D500 Iron deficiency anemia secondary to blood loss (chronic): Secondary | ICD-10-CM

## 2021-05-01 LAB — B. BURGDORFI ANTIBODIES BY WB
B burgdorferi IgG Abs (IB): NEGATIVE
B burgdorferi IgM Abs (IB): NEGATIVE
Lyme Disease 18 kD IgG: NONREACTIVE
Lyme Disease 23 kD IgG: NONREACTIVE
Lyme Disease 23 kD IgM: REACTIVE — AB
Lyme Disease 28 kD IgG: NONREACTIVE
Lyme Disease 30 kD IgG: NONREACTIVE
Lyme Disease 39 kD IgG: NONREACTIVE
Lyme Disease 39 kD IgM: NONREACTIVE
Lyme Disease 41 kD IgG: REACTIVE — AB
Lyme Disease 41 kD IgM: NONREACTIVE
Lyme Disease 45 kD IgG: NONREACTIVE
Lyme Disease 58 kD IgG: NONREACTIVE
Lyme Disease 66 kD IgG: NONREACTIVE
Lyme Disease 93 kD IgG: NONREACTIVE

## 2021-05-01 LAB — COMPREHENSIVE METABOLIC PANEL
AG Ratio: 1.6 (calc) (ref 1.0–2.5)
ALT: 18 U/L (ref 6–29)
AST: 20 U/L (ref 10–35)
Albumin: 4.3 g/dL (ref 3.6–5.1)
Alkaline phosphatase (APISO): 96 U/L (ref 37–153)
BUN: 10 mg/dL (ref 7–25)
CO2: 26 mmol/L (ref 20–32)
Calcium: 9.7 mg/dL (ref 8.6–10.4)
Chloride: 104 mmol/L (ref 98–110)
Creat: 1.02 mg/dL (ref 0.50–1.05)
Globulin: 2.7 g/dL (calc) (ref 1.9–3.7)
Glucose, Bld: 74 mg/dL (ref 65–99)
Potassium: 4.4 mmol/L (ref 3.5–5.3)
Sodium: 139 mmol/L (ref 135–146)
Total Bilirubin: 0.4 mg/dL (ref 0.2–1.2)
Total Protein: 7 g/dL (ref 6.1–8.1)

## 2021-05-01 LAB — CBC
HCT: 35 % (ref 35.0–45.0)
Hemoglobin: 10.6 g/dL — ABNORMAL LOW (ref 11.7–15.5)
MCH: 22.7 pg — ABNORMAL LOW (ref 27.0–33.0)
MCHC: 30.3 g/dL — ABNORMAL LOW (ref 32.0–36.0)
MCV: 75.1 fL — ABNORMAL LOW (ref 80.0–100.0)
MPV: 11.5 fL (ref 7.5–12.5)
Platelets: 230 10*3/uL (ref 140–400)
RBC: 4.66 10*6/uL (ref 3.80–5.10)
RDW: 18.5 % — ABNORMAL HIGH (ref 11.0–15.0)
WBC: 5.4 10*3/uL (ref 3.8–10.8)

## 2021-05-01 LAB — IRON,TIBC AND FERRITIN PANEL
%SAT: 5 % (calc) — ABNORMAL LOW (ref 16–45)
Ferritin: 6 ng/mL — ABNORMAL LOW (ref 16–288)
Iron: 21 ug/dL — ABNORMAL LOW (ref 45–160)
TIBC: 427 mcg/dL (calc) (ref 250–450)

## 2021-05-01 NOTE — Telephone Encounter (Signed)
Spoke with pt regarding labs and instructions.   

## 2021-05-01 NOTE — Telephone Encounter (Signed)
Please inform patient the following information: Her blood cell count is still lower than normal at 10.6, but improving which is reassuring. It can take a few months to get levels back to normal.  Her liver and kidney fx.  Her iron levels are lower and her iron stores are depleted. Since she is still having symptoms. I suggest we refer her to hematology (blood specialists) to be considered for transfusion of iron since she has been taking iron and has not recouped levels. Continue supplement for now.  Lyme titers are still pending.

## 2021-05-09 ENCOUNTER — Ambulatory Visit: Payer: Medicare Other | Admitting: Family Medicine

## 2021-05-11 ENCOUNTER — Ambulatory Visit
Admission: RE | Admit: 2021-05-11 | Discharge: 2021-05-11 | Disposition: A | Discharge status: Home / Self Care | Payer: Medicare Other | Source: Ambulatory Visit | Attending: Family Medicine | Admitting: Family Medicine

## 2021-05-11 ENCOUNTER — Other Ambulatory Visit: Payer: Self-pay

## 2021-05-11 DIAGNOSIS — Z1231 Encounter for screening mammogram for malignant neoplasm of breast: Secondary | ICD-10-CM

## 2021-05-19 ENCOUNTER — Inpatient Hospital Stay: Payer: Medicare Other

## 2021-05-19 ENCOUNTER — Other Ambulatory Visit: Payer: Self-pay

## 2021-05-19 ENCOUNTER — Encounter: Payer: Self-pay | Admitting: Hematology & Oncology

## 2021-05-19 ENCOUNTER — Inpatient Hospital Stay: Payer: Medicare Other | Attending: Hematology & Oncology | Admitting: Hematology & Oncology

## 2021-05-19 VITALS — BP 123/69 | HR 81 | Temp 98.0°F | Resp 16 | Wt 156.0 lb

## 2021-05-19 DIAGNOSIS — Z86010 Personal history of colonic polyps: Secondary | ICD-10-CM | POA: Diagnosis not present

## 2021-05-19 DIAGNOSIS — K5730 Diverticulosis of large intestine without perforation or abscess without bleeding: Secondary | ICD-10-CM | POA: Diagnosis not present

## 2021-05-19 DIAGNOSIS — R002 Palpitations: Secondary | ICD-10-CM | POA: Insufficient documentation

## 2021-05-19 DIAGNOSIS — Z803 Family history of malignant neoplasm of breast: Secondary | ICD-10-CM | POA: Diagnosis not present

## 2021-05-19 DIAGNOSIS — D500 Iron deficiency anemia secondary to blood loss (chronic): Secondary | ICD-10-CM

## 2021-05-19 DIAGNOSIS — K254 Chronic or unspecified gastric ulcer with hemorrhage: Secondary | ICD-10-CM | POA: Diagnosis not present

## 2021-05-19 DIAGNOSIS — A048 Other specified bacterial intestinal infections: Secondary | ICD-10-CM | POA: Diagnosis not present

## 2021-05-19 DIAGNOSIS — R0789 Other chest pain: Secondary | ICD-10-CM | POA: Insufficient documentation

## 2021-05-19 DIAGNOSIS — K909 Intestinal malabsorption, unspecified: Secondary | ICD-10-CM

## 2021-05-19 DIAGNOSIS — Z7189 Other specified counseling: Secondary | ICD-10-CM

## 2021-05-19 HISTORY — DX: Other specified counseling: Z71.89

## 2021-05-19 HISTORY — DX: Intestinal malabsorption, unspecified: K90.9

## 2021-05-19 LAB — RETICULOCYTES
Immature Retic Fract: 15 % (ref 2.3–15.9)
RBC.: 4.49 MIL/uL (ref 3.87–5.11)
Retic Count, Absolute: 47.6 10*3/uL (ref 19.0–186.0)
Retic Ct Pct: 1.1 % (ref 0.4–3.1)

## 2021-05-19 LAB — CBC WITH DIFFERENTIAL (CANCER CENTER ONLY)
Abs Immature Granulocytes: 0.01 10*3/uL (ref 0.00–0.07)
Basophils Absolute: 0.1 10*3/uL (ref 0.0–0.1)
Basophils Relative: 1 %
Eosinophils Absolute: 0.1 10*3/uL (ref 0.0–0.5)
Eosinophils Relative: 1 %
HCT: 33.9 % — ABNORMAL LOW (ref 36.0–46.0)
Hemoglobin: 10.5 g/dL — ABNORMAL LOW (ref 12.0–15.0)
Immature Granulocytes: 0 %
Lymphocytes Relative: 46 %
Lymphs Abs: 2.1 10*3/uL (ref 0.7–4.0)
MCH: 23.3 pg — ABNORMAL LOW (ref 26.0–34.0)
MCHC: 31 g/dL (ref 30.0–36.0)
MCV: 75.2 fL — ABNORMAL LOW (ref 80.0–100.0)
Monocytes Absolute: 0.5 10*3/uL (ref 0.1–1.0)
Monocytes Relative: 10 %
Neutro Abs: 2 10*3/uL (ref 1.7–7.7)
Neutrophils Relative %: 42 %
Platelet Count: 238 10*3/uL (ref 150–400)
RBC: 4.51 MIL/uL (ref 3.87–5.11)
RDW: 19.8 % — ABNORMAL HIGH (ref 11.5–15.5)
WBC Count: 4.7 10*3/uL (ref 4.0–10.5)
nRBC: 0 % (ref 0.0–0.2)

## 2021-05-19 LAB — CMP (CANCER CENTER ONLY)
ALT: 25 U/L (ref 0–44)
AST: 26 U/L (ref 15–41)
Albumin: 4.2 g/dL (ref 3.5–5.0)
Alkaline Phosphatase: 93 U/L (ref 38–126)
Anion gap: 6 (ref 5–15)
BUN: 12 mg/dL (ref 8–23)
CO2: 28 mmol/L (ref 22–32)
Calcium: 9.9 mg/dL (ref 8.9–10.3)
Chloride: 105 mmol/L (ref 98–111)
Creatinine: 0.97 mg/dL (ref 0.44–1.00)
GFR, Estimated: >60 mL/min (ref >60)
Glucose, Bld: 88 mg/dL (ref 70–99)
Potassium: 4.3 mmol/L (ref 3.5–5.1)
Sodium: 139 mmol/L (ref 135–145)
Total Bilirubin: 0.5 mg/dL (ref 0.3–1.2)
Total Protein: 6.7 g/dL (ref 6.5–8.1)

## 2021-05-19 LAB — SAVE SMEAR(SSMR), FOR PROVIDER SLIDE REVIEW

## 2021-05-19 NOTE — Progress Notes (Signed)
Referral MD  Reason for Referral: Iron deficiency anemia-GI bleeding-Helicobacter associated ulcers  Chief Complaint  Patient presents with   New Patient (Initial Visit)  : Iron is still low.  HPI: Christy Kelly is a very charming 70 year old white female.  She certainly looks a lot younger.  She comes in with her husband.  She is from [place].  It was a hold off on talking that she and her husband.  He was [employer].  Her father was Nature conservation officer.  Thankfully, there were in my [place] room.  She has been very healthy.  She recently had issues with fatigue and anemia.  She was found to be significantly iron deficient.  This was back in August.  Back in October 2021, her hemoglobin was 11.8.  The MCV was 80.  In early August, her hemoglobin went down to 8.7.  MCV went down to 71.  At that time, she had heme positive stools.  She had upper endoscopy and colonoscopy.  The upper endoscopy showed some ulcers.  They were Helicobacter positive.  She has been on treatment for this.  She had polyps in the colon.  She had diverticuli in the colon.  In early August, her iron studies showed a ferritin of 36 with an iron saturation of 6%.  She was placed on oral iron.  She did have a hard time with oral iron.  She is having abdominal discomfort.  On 04/28/2021, her iron studies showed a ferritin of 6 with an iron saturation of 5%.  She had a CT scan that was done of the abdomen and pelvis.  This was unremarkable aside of some constipation.  Because of some elevated liver function studies, she had an MRI done.  This was done on 03/20/2021.  Everything looks fine.  She had a normal liver and normal pancreas and biliary tree.  She says that she has some palpitations.  She has little bit of chest discomfort.  She does not smoke.  She does not drink.  She is not a vegetarian.  She just feels tired.  She does not have a lot of energy.  I am sure that her iron is probably low again.  Currently, I would have  to say that her performance status is probably ECOG 1.  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  :   Past Surgical History:  Procedure Laterality Date   BUNIONECTOMY     x2 about 2012 bilateral.    COLONOSCOPY  03/29/2021   Attending MD: Ronnette Juniper, MD; Location: [facility]  03/29/2021   Attending MD: Ronnette Juniper, MD; Location: [facility]   WISDOM TOOTH EXTRACTION    :   Current Outpatient Medications:    melatonin 5 MG TABS, Take 5 mg by mouth at bedtime as needed., Disp: , Rfl:    sennosides-docusate sodium (SENOKOT-S) 8.6-50 MG tablet, Take 1 tablet by mouth daily., Disp: , Rfl:    calcium carbonate (OS-CAL) 600 MG TABS tablet, Take 600 mg by mouth 2 (two) times daily with a meal., Disp: , Rfl:    Cetirizine HCl (ZYRTEC PO), Take by mouth., Disp: , Rfl:    Cholecalciferol (VITAMIN D) 50  MCG (2000 UT) CAPS, Take 1 capsule by mouth daily., Disp: , Rfl:    Coenzyme Q10 (COQ10) 100 MG CAPS, Take 1 capsule by mouth daily., Disp: , Rfl:    iron polysaccharides (NIFEREX) 150 MG capsule, Take 1 capsule (150 mg total) by mouth daily., Disp: 90 capsule, Rfl: 1   MAGNESIUM PO, Take 500 mg by mouth daily. , Disp: , Rfl:    Omega-3 Fatty Acids (FISH OIL) 1000 MG CAPS, Take 1,000 mg by mouth daily., Disp: , Rfl:    pantoprazole (PROTONIX) 40 MG tablet, Take 40 mg by mouth 2 (two) times daily., Disp: , Rfl:    vitamin B-12 (CYANOCOBALAMIN) 250 MCG tablet, Take 250 mcg by mouth daily., Disp: , Rfl: :  :   Allergies  Allergen Reactions   Codeine Itching and Nausea And Vomiting  :   Family History  Problem Relation Age of Onset   Stroke Father    Early death Father    Hypertension Brother     Hypercholesterolemia Brother    Alcohol abuse Brother    Breast cancer Paternal Grandmother    Cancer Paternal Grandfather        Uncertain type- Li fraumeni gene   Colon cancer Neg Hx    Esophageal cancer Neg Hx    Rectal cancer Neg Hx    Stomach cancer Neg Hx   :   Social History   Socioeconomic History   Marital status: Married    Spouse name: Richardson Landry   Number of children: 2   Years of education: 13   Highest education level: Not on file  Occupational History    Comment: [employer]  Tobacco Use   Smoking status: Never   Smokeless tobacco: Never  Vaping Use   Vaping Use: Never used  Substance and Sexual Activity   Alcohol use: No   Drug use: No   Sexual activity: Yes    Partners: Male  Other Topics Concern   Not on file  Social History Narrative   Marital status/children/pets: married, lives with husband.    Education/employment: 1 year of college-retired.  Worked as a Information systems manager.   Safety:      -smoke alarm in the home:Yes     - wears seatbelt: Yes     - Feels safe in their relationships: Yes         Social Determinants of Health   Financial Resource Strain: Not on file  Food Insecurity: Not on file  Transportation Needs: Not on file  Physical Activity: Not on file  Stress: Not on file  Social Connections: Not on file  Intimate Partner Violence: Not on file  :  Review of Systems  Constitutional:  Positive for malaise/fatigue.  HENT: Negative.    Eyes: Negative.   Respiratory: Negative.    Cardiovascular:  Positive for palpitations.  Gastrointestinal:  Positive for abdominal pain and heartburn.  Genitourinary: Negative.   Musculoskeletal: Negative.   Skin: Negative.   Neurological: Negative.   Endo/Heme/Allergies: Negative.   Psychiatric/Behavioral: Negative.      Exam: '@IPVITALS' @ This is a well-developed and well-nourished white female in no obvious distress.  Vital signs show temperature of 98.  Pulse 81.  Blood  pressure 125/69.  Weight is 156 pounds.  Head and neck exam shows no ocular or oral lesions.  She has no scleral icterus.  There is no adenopathy in the neck.  Thyroid is nonpalpable.  Lungs are clear bilaterally.  Cardiac exam regular rate  and rhythm with no murmurs, rubs or bruits.  Abdomen is soft.  She has decent bowel sounds.  There is little bit of tenderness in the epigastric area.  She has no fluid wave.  There is no palpable liver or spleen tip.  Back exam shows no tenderness over the spine, ribs or hips.  Extremities shows no clubbing, cyanosis or edema.  Neurological exam shows no focal neurological deficits.  Skin exam shows no rashes, ecchymosis or petechia.   Recent Labs    05/19/21 1332  WBC 4.7  HGB 10.5*  HCT 33.9*  PLT 238    Recent Labs    05/19/21 1332  NA 139  K 4.3  CL 105  CO2 28  GLUCOSE 88  BUN 12  CREATININE 0.97  CALCIUM 9.9    Blood smear review: This is microcytic and slight poikilocytosis.  She has no target cells.  I see no rouleaux formation.  There is no nucleated red blood cells.  I see no schistocytes or spherocytes.  White blood cells appear normal in morphology and maturation.  She has no immature myeloid or lymphoid forms.  She has no hypersegmented polys.  There is no blasts.  Platelets are adequate in number and size.  Platelets are well granulated.  Pathology: None    Assessment and Plan: Christy Kelly is a very charming 70 year old white female.  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  I still suspect she is iron deficient by her blood smear.  The MCV is still quite low.  I think the IV iron is going to help her.  I really do not think oral iron is going to help because she is on an acids.  This will prevent iron from being absorbed as iron needs an acidic environment to absorb.    Hopefully, we can use Monoferric.  I think this would be a good idea for her.  We will have to see what her iron levels look like to  determine how much she will need.  I do not see that we have to do a bone marrow biopsy on her.  I just do not see that this is going to add any further information for [place].  I will plan to have her come back after she gets her iron.  We likely will plan to get her back sometime in late November.  It was wonderful talking with she and her husband.  They are both so nice.

## 2021-05-20 LAB — ERYTHROPOIETIN: Erythropoietin: 43.7 m[IU]/mL — ABNORMAL HIGH (ref 2.6–18.5)

## 2021-05-22 LAB — IRON AND TIBC
Iron: 35 ug/dL — ABNORMAL LOW (ref 41–142)
Saturation Ratios: 8 % — ABNORMAL LOW (ref 21–57)
TIBC: 440 ug/dL (ref 236–444)
UIBC: 405 ug/dL — ABNORMAL HIGH (ref 120–384)

## 2021-05-22 LAB — FERRITIN: Ferritin: 7 ng/mL — ABNORMAL LOW (ref 11–307)

## 2021-05-23 ENCOUNTER — Telehealth: Payer: Self-pay

## 2021-05-23 NOTE — Telephone Encounter (Signed)
Called and informed patient of lab results, patient verbalized understanding and denies any questions or concerns at this time. Iron infusion already scheduled for 05/25/21 @ 1:00 PM.

## 2021-05-23 NOTE — Telephone Encounter (Signed)
-----   Message from Volanda Napoleon, MD sent at 05/22/2021  4:56 PM EDT ----- Call - the iron is very low!!!  She needs IV iron.  Please set up for this week!!!  [name]

## 2021-05-25 ENCOUNTER — Inpatient Hospital Stay: Payer: Medicare Other

## 2021-05-25 ENCOUNTER — Other Ambulatory Visit: Payer: Self-pay

## 2021-05-25 VITALS — BP 119/66 | HR 75 | Temp 97.7°F | Resp 17

## 2021-05-25 DIAGNOSIS — R002 Palpitations: Secondary | ICD-10-CM | POA: Diagnosis not present

## 2021-05-25 DIAGNOSIS — A048 Other specified bacterial intestinal infections: Secondary | ICD-10-CM | POA: Diagnosis not present

## 2021-05-25 DIAGNOSIS — K254 Chronic or unspecified gastric ulcer with hemorrhage: Secondary | ICD-10-CM | POA: Diagnosis not present

## 2021-05-25 DIAGNOSIS — K5730 Diverticulosis of large intestine without perforation or abscess without bleeding: Secondary | ICD-10-CM | POA: Diagnosis not present

## 2021-05-25 DIAGNOSIS — D500 Iron deficiency anemia secondary to blood loss (chronic): Secondary | ICD-10-CM | POA: Diagnosis not present

## 2021-05-25 DIAGNOSIS — K909 Intestinal malabsorption, unspecified: Secondary | ICD-10-CM

## 2021-05-25 DIAGNOSIS — R0789 Other chest pain: Secondary | ICD-10-CM | POA: Diagnosis not present

## 2021-05-25 DIAGNOSIS — Z803 Family history of malignant neoplasm of breast: Secondary | ICD-10-CM | POA: Diagnosis not present

## 2021-05-25 DIAGNOSIS — Z86010 Personal history of colonic polyps: Secondary | ICD-10-CM | POA: Diagnosis not present

## 2021-05-25 MED ORDER — SODIUM CHLORIDE 0.9 % IV SOLN
1000.0000 mg | Freq: Once | INTRAVENOUS | Status: AC
  Administered 2021-05-25: 1000 mg via INTRAVENOUS
  Filled 2021-05-25: qty 10

## 2021-05-25 MED ORDER — LORATADINE 10 MG PO TABS
10.0000 mg | ORAL_TABLET | Freq: Once | ORAL | Status: AC
  Administered 2021-05-25: 10 mg via ORAL
  Filled 2021-05-25: qty 1

## 2021-05-25 MED ORDER — SODIUM CHLORIDE 0.9 % IV SOLN: Freq: Once | INTRAVENOUS | Status: AC

## 2021-05-25 MED ORDER — ACETAMINOPHEN 325 MG PO TABS
650.0000 mg | ORAL_TABLET | Freq: Once | ORAL | Status: AC
  Administered 2021-05-25: 650 mg via ORAL
  Filled 2021-05-25: qty 2

## 2021-05-25 NOTE — Patient Instructions (Signed)

## 2021-05-26 ENCOUNTER — Ambulatory Visit (INDEPENDENT_AMBULATORY_CARE_PROVIDER_SITE_OTHER): Payer: Medicare Other | Admitting: Family Medicine

## 2021-05-26 ENCOUNTER — Encounter: Payer: Self-pay | Admitting: Family Medicine

## 2021-05-26 VITALS — BP 106/72 | HR 90 | Temp 98.3°F | Ht 68.25 in | Wt 158.0 lb

## 2021-05-26 DIAGNOSIS — Z0000 Encounter for general adult medical examination without abnormal findings: Secondary | ICD-10-CM

## 2021-05-26 DIAGNOSIS — K279 Peptic ulcer, site unspecified, unspecified as acute or chronic, without hemorrhage or perforation: Secondary | ICD-10-CM

## 2021-05-26 DIAGNOSIS — Z23 Encounter for immunization: Secondary | ICD-10-CM

## 2021-05-26 DIAGNOSIS — Z131 Encounter for screening for diabetes mellitus: Secondary | ICD-10-CM

## 2021-05-26 DIAGNOSIS — Z1231 Encounter for screening mammogram for malignant neoplasm of breast: Secondary | ICD-10-CM | POA: Diagnosis not present

## 2021-05-26 DIAGNOSIS — R768 Other specified abnormal immunological findings in serum: Secondary | ICD-10-CM

## 2021-05-26 DIAGNOSIS — Z13220 Encounter for screening for lipoid disorders: Secondary | ICD-10-CM

## 2021-05-26 DIAGNOSIS — Z79899 Other long term (current) drug therapy: Secondary | ICD-10-CM | POA: Diagnosis not present

## 2021-05-26 LAB — LIPID PANEL
Cholesterol: 202 mg/dL — ABNORMAL HIGH (ref 0–200)
HDL: 46.7 mg/dL (ref >39.00)
NonHDL: 155.76
Total CHOL/HDL Ratio: 4
Triglycerides: 206 mg/dL — ABNORMAL HIGH (ref 0.0–149.0)
VLDL: 41.2 mg/dL — ABNORMAL HIGH (ref 0.0–40.0)

## 2021-05-26 LAB — MAGNESIUM: Magnesium: 2.1 mg/dL (ref 1.5–2.5)

## 2021-05-26 LAB — VITAMIN D 25 HYDROXY (VIT D DEFICIENCY, FRACTURES): VITD: 69.21 ng/mL (ref 30.00–100.00)

## 2021-05-26 LAB — HEMOGLOBIN A1C: Hgb A1c MFr Bld: 6 % (ref 4.6–6.5)

## 2021-05-26 LAB — LDL CHOLESTEROL, DIRECT: Direct LDL: 111 mg/dL

## 2021-05-26 LAB — VITAMIN B12: Vitamin B-12: 447 pg/mL (ref 211–911)

## 2021-05-26 MED ORDER — TETANUS-DIPHTH-ACELL PERTUSSIS 5-2-15.5 LF-MCG/0.5 IM SUSP: 0.5000 mL | Freq: Once | INTRAMUSCULAR | 0 refills | Status: AC

## 2021-05-26 MED ORDER — PANTOPRAZOLE SODIUM 40 MG PO TBEC: 40.0000 mg | DELAYED_RELEASE_TABLET | Freq: Two times a day (BID) | ORAL | 1 refills | Status: DC

## 2021-05-26 NOTE — Patient Instructions (Signed)
Great to see you today.  I have refilled the medication(s) we provide.   If labs were collected, we will inform you of lab results once received either by echart message or telephone call.   - echart message- for normal results that have been seen by the patient already.   - telephone call: abnormal results or if patient has not viewed results in their echart. Health Maintenance, Female Adopting a healthy lifestyle and getting preventive care are important in promoting health and wellness. Ask your health care provider about: The right schedule for you to have regular tests and exams. Things you can do on your own to prevent diseases and keep yourself healthy. What should I know about diet, weight, and exercise? Eat a healthy diet  Eat a diet that includes plenty of vegetables, fruits, low-fat dairy products, and lean protein. Do not eat a lot of foods that are high in solid fats, added sugars, or sodium. Maintain a healthy weight Body mass index (BMI) is used to identify weight problems. It estimates body fat based on height and weight. Your health care provider can help determine your BMI and help you achieve or maintain a healthy weight. Get regular exercise Get regular exercise. This is one of the most important things you can do for your health. Most adults should: Exercise for at least 150 minutes each week. The exercise should increase your heart rate and make you sweat (moderate-intensity exercise). Do strengthening exercises at least twice a week. This is in addition to the moderate-intensity exercise. Spend less time sitting. Even light physical activity can be beneficial. Watch cholesterol and blood lipids Have your blood tested for lipids and cholesterol at 68 years of age, then have this test every 5 years. Have your cholesterol levels checked more often if: Your lipid or cholesterol levels are high. You are older than 68 years of age. You are at high risk for heart  disease. What should I know about cancer screening? Depending on your health history and family history, you may need to have cancer screening at various ages. This may include screening for: Breast cancer. Cervical cancer. Colorectal cancer. Skin cancer. Lung cancer. What should I know about heart disease, diabetes, and high blood pressure? Blood pressure and heart disease High blood pressure causes heart disease and increases the risk of stroke. This is more likely to develop in people who have high blood pressure readings, are of African descent, or are overweight. Have your blood pressure checked: Every 3-5 years if you are 18-39 years of age. Every year if you are 40 years old or older. Diabetes Have regular diabetes screenings. This checks your fasting blood sugar level. Have the screening done: Once every three years after age 40 if you are at a normal weight and have a low risk for diabetes. More often and at a younger age if you are overweight or have a high risk for diabetes. What should I know about preventing infection? Hepatitis B If you have a higher risk for hepatitis B, you should be screened for this virus. Talk with your health care provider to find out if you are at risk for hepatitis B infection. Hepatitis C Testing is recommended for: Everyone born from 1945 through 1965. Anyone with known risk factors for hepatitis C. Sexually transmitted infections (STIs) Get screened for STIs, including gonorrhea and chlamydia, if: You are sexually active and are younger than 68 years of age. You are older than 68 years of age and your   health care provider tells you that you are at risk for this type of infection. Your sexual activity has changed since you were last screened, and you are at increased risk for chlamydia or gonorrhea. Ask your health care provider if you are at risk. Ask your health care provider about whether you are at high risk for HIV. Your health care provider  may recommend a prescription medicine to help prevent HIV infection. If you choose to take medicine to prevent HIV, you should first get tested for HIV. You should then be tested every 3 months for as long as you are taking the medicine. Pregnancy If you are about to stop having your period (premenopausal) and you may become pregnant, seek counseling before you get pregnant. Take 400 to 800 micrograms (mcg) of folic acid every day if you become pregnant. Ask for birth control (contraception) if you want to prevent pregnancy. Osteoporosis and menopause Osteoporosis is a disease in which the bones lose minerals and strength with aging. This can result in bone fractures. If you are 65 years old or older, or if you are at risk for osteoporosis and fractures, ask your health care provider if you should: Be screened for bone loss. Take a calcium or vitamin D supplement to lower your risk of fractures. Be given hormone replacement therapy (HRT) to treat symptoms of menopause. Follow these instructions at home: Lifestyle Do not use any products that contain nicotine or tobacco, such as cigarettes, e-cigarettes, and chewing tobacco. If you need help quitting, ask your health care provider. Do not use street drugs. Do not share needles. Ask your health care provider for help if you need support or information about quitting drugs. Alcohol use Do not drink alcohol if: Your health care provider tells you not to drink. You are pregnant, may be pregnant, or are planning to become pregnant. If you drink alcohol: Limit how much you use to 0-1 drink a day. Limit intake if you are breastfeeding. Be aware of how much alcohol is in your drink. In the [place] one drink equals one 12 oz bottle of beer (355 mL), one 5 oz glass of wine (148 mL), or one 1 oz glass of hard liquor (44 mL). General instructions Schedule regular health, dental, and eye exams. Stay current with your vaccines. Tell your health care  provider if: You often feel depressed. You have ever been abused or do not feel safe at home. Summary Adopting a healthy lifestyle and getting preventive care are important in promoting health and wellness. Follow your health care provider's instructions about healthy diet, exercising, and getting tested or screened for diseases. Follow your health care provider's instructions on monitoring your cholesterol and blood pressure. This information is not intended to replace advice given to you by your health care provider. Make sure you discuss any questions you have with your health care provider. Document Revised: 09/30/2020 Document Reviewed: 07/16/2018 Elsevier Patient Education  2022 [employer]  

## 2021-05-26 NOTE — Progress Notes (Signed)
This visit occurred during the SARS-CoV-2 public health emergency.  Safety protocols were in place, including screening questions prior to the visit, additional usage of staff PPE, and extensive cleaning of exam room while observing appropriate contact time as indicated for disinfecting solutions.    Patient ID: Christy Kelly, female  DOB: 01-10-1951, 70 y.o.   MRN: [id number] Patient Care Team    Relationship Specialty Notifications Start End  Ma Hillock, DO PCP - General Family Medicine  03/14/21   Dian Queen, MD Consulting Physician Obstetrics and Gynecology  02/16/19   Satira Sark, MD Consulting Physician Cardiology  04/07/19   Penni Bombard, MD Consulting Physician Neurology  04/07/19   Ronnette Juniper, MD Consulting Physician Gastroenterology  03/14/21     Chief Complaint  Patient presents with   Annual Exam    Pt is not fasting    Subjective: Christy Kelly is a 70 y.o.  Female  present for CPE. All past medical history, surgical history, allergies, family history, immunizations, medications and social history were updated in the electronic medical record today. All recent labs, ED visits and hospitalizations within the last year were reviewed.  Health maintenance:  Colonoscopy: completed 03/2021, by Dr. Therisa Doyne [facility]). follow up 3 yr. Mammogram: completed:05/11/2021. [facility] > order placed for next year Cervical cancer screening: > 65 followed by gyn Immunizations: tdap 2008-DUE printed, Influenza administered today.(encouraged yearly), PNA series completed, shingrix series completed 01/2019, covid series completed Infectious disease screening:  Hep C completed DEXA: last completed 08/2018, result -1.3, follow by GYN Assistive device: none Oxygen ZRA:QTMA Patient has a Dental home. Hospitalizations/ED visits: reviewed  [place]: Following with GI and HEME.Iron transfusion completed yesterday- reviewed labs from week prior.  iron deficiency anemia and fatigue.  She  has now been seen by [facility] gastroenterology.  She underwent ERCP, EGD and colonoscopy which resulted with peptic ulcers, positive H. pylori and colon polyps.  She was treated with clarithromycin x10 days.  She reports the stomach pain has improved since starting Protonix twice daily.  She still occasionally will have the gnawing pain if she eats meat.  She is feeling improved from the fatigue standpoint.  Still can be mildly short of breath if going up steps.  However she feels much improved.  She is tolerating the iron daily.  She is also taking Florastor probiotic.  She had a positive Lyme antibody and equivocal Western blot.  She was treated appropriately with doxycycline x21 days.  Depression screen [facility] 2/9 05/26/2021 05/26/2020 04/07/2019 02/13/2019  Decreased Interest 0 0 0 0  Down, Depressed, Hopeless 0 0 0 0  PHQ - 2 Score 0 0 0 0   No flowsheet data found.  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   Allergies  Allergen Reactions   Codeine Itching and Nausea And Vomiting   Past Surgical History:  Procedure Laterality Date   BUNIONECTOMY     x2  about 2012 bilateral.    COLONOSCOPY  03/29/2021   Attending MD: Ronnette Juniper, MD; Location: [facility]  03/29/2021   Attending MD: Ronnette Juniper, MD; Location:  [place] EXTRACTION     Family History  Problem Relation Age of Onset   Stroke Father    Early death Father    Hypertension Brother    Hypercholesterolemia Brother    Alcohol abuse Brother    Breast cancer Paternal Grandmother    Cancer Paternal Grandfather        Uncertain type- Li fraumeni gene   Colon cancer Neg Hx    Esophageal cancer Neg Hx    Rectal cancer Neg Hx    Stomach cancer Neg Hx    Social History   Social History Narrative   Marital status/children/pets: married, lives with husband.    Education/employment: 1 year of college-retired.  Worked as a Information systems manager.   Safety:      -smoke alarm in the home:Yes     - wears seatbelt: Yes     - Feels safe in their relationships: Yes          Allergies as of 05/26/2021       Reactions   Codeine Itching, Nausea And Vomiting        Medication List        Accurate as of May 26, 2021 10:27 AM. If you have any questions, ask your nurse or doctor.          STOP taking these medications    iron polysaccharides 150 MG capsule Commonly known as: NIFEREX Stopped by: Howard Pouch, DO       TAKE these medications    calcium carbonate 600 MG Tabs tablet Commonly known as: OS-CAL Take 600 mg by mouth 2 (two) times daily with a meal.   CoQ10 100 MG Caps Take 1 capsule by mouth daily.   Fish Oil 1000 MG Caps Take 1,000 mg by mouth daily.   MAGNESIUM PO Take 500 mg by mouth daily.   melatonin 5 MG Tabs Take 5 mg by mouth at bedtime as needed.   pantoprazole 40 MG tablet Commonly known as: PROTONIX Take 1 tablet (40 mg total) by mouth 2 (two) times daily.   sennosides-docusate sodium 8.6-50 MG tablet Commonly known as: SENOKOT-S Take 1 tablet by mouth daily.   Tdap 12-05-13.5 LF-MCG/0.5 injection Commonly known as: ADACEL Inject 0.5 mLs into the muscle once for 1 dose. Started by: Howard Pouch, DO   vitamin B-12 250 MCG tablet Commonly known as:  CYANOCOBALAMIN Take 250 mcg by mouth daily.   Vitamin D 50 MCG (2000 UT) Caps Take 1 capsule by mouth daily.   ZYRTEC PO Take by mouth.        All past medical history, surgical history, allergies, family history, immunizations andmedications were updated in the EMR today and reviewed under the history and medication portions of their EMR.       MM 3D SCREEN BREAST BILATERAL Result Date: 05/15/2021 RECOMMENDATION: Screening mammogram in one year. (Code:[id number]-[id number]) BI-RADS CATEGORY  1: Negative. Electronically Signed   By: Kristopher Oppenheim M.D.   On: 05/15/2021 14:57    ROS: 14 pt review of systems performed and negative (unless mentioned in an HPI)  Objective: BP 106/72   Pulse 90   Temp 98.3 F (36.8 C) (Oral)   Ht 5'  8.25" (1.734 m)   Wt 158 lb (71.7 kg)   SpO2 98%   BMI 23.85 kg/m  Gen: Afebrile. No acute distress. Nontoxic in appearance, well-developed, well-nourished,  very pleasant female.  HENT: AT. [place]. Bilateral TM visualized and normal in appearance, normal external auditory canal. MMM, no oral lesions, adequate dentition. Bilateral nares within normal limits. Throat without erythema, ulcerations or exudates. no Cough on exam, no hoarseness on exam. Eyes:Pupils Equal Round Reactive to light, Extraocular movements intact,  Conjunctiva without redness, discharge or icterus. Neck/lymp/endocrine: Supple,no lymphadenopathy, no thyromegaly CV: RRR no murmur, no edema, +2/4 P posterior tibialis pulses.  Chest: CTAB, no wheeze, rhonchi or crackles. normal Respiratory effort. good Air movement. Abd: Soft. flat. NTND. BS present. no Masses palpated. No hepatosplenomegaly. No rebound tenderness or guarding. Skin: no rashes, purpura or petechiae. Warm and well-perfused. Skin intact. Neuro/Msk:  Normal gait. PERLA. EOMi. Alert. Oriented x3.  Cranial nerves II through XII intact. Muscle strength 5/5 upper/lower extremity. DTRs equal bilaterally. Psych: Normal affect, dress and  demeanor. Normal speech. Normal thought content and judgment.   No results found.  Assessment/plan: Christy Kelly is a 70 y.o. female present for CPE CBC, CMP, iron panel, TSH labs reviewed from recent collection.  PUD (peptic ulcer disease)/Helicobacter pylori antibody positive - continue protonix 40 mg BID - capsule endoscopy scheduled this week.  - iron transfusion yesterday Need for influenza vaccination - Flu Vaccine QUAD High Dose(Fluad) Diabetes mellitus screening - Hemoglobin A1c Lipid screening - Lipid panel  Need for Tdap vaccination - Tdap (ADACEL) 12-05-13.5 LF-MCG/0.5 injection; Inject 0.5 mLs into the muscle once for 1 dose.  Dispense: 0.5 mL; Refill: 0  Routine general medical examination at a health care facility Colonoscopy: completed 03/2021, by Dr. Therisa Doyne [facility]). follow up 3 yr. Mammogram: completed:05/11/2021. [facility] > order placed for next year Cervical cancer screening: > 65 followed by gyn Immunizations: tdap 2008-DUE printed, Influenza administered today.(encouraged yearly), PNA series completed, shingrix series completed 01/2019, covid series completed Infectious disease screening:  Hep C completed DEXA: last completed 08/2018, result -1.3, follow by GYN Patient was encouraged to exercise greater than 150 minutes a week. Patient was encouraged to choose a diet filled with fresh fruits and vegetables, and lean meats. AVS provided to patient today for education/recommendation on gender specific health and safety maintenance.  Return in about 1 year (around 05/28/2022) for CPE (30 min).    Orders Placed This Encounter  Procedures   MM 3D SCREEN BREAST BILATERAL   Flu Vaccine QUAD High Dose(Fluad)   Hemoglobin A1c   Lipid panel   Vitamin D (25 hydroxy)   B12   Magnesium   Meds ordered this encounter  Medications   Tdap (ADACEL) 12-05-13.5 LF-MCG/0.5 injection    Sig: Inject 0.5 mLs into the muscle once for 1 dose.    Dispense:  0.5 mL    Refill:  0    pantoprazole (PROTONIX) 40 MG tablet    Sig: Take 1 tablet (40 mg total) by mouth 2 (two) times daily.    Dispense:  180 tablet    Refill:  1   Referral Orders  No referral(s) requested today     Electronically signed by: Howard Pouch, [place]

## 2021-05-30 DIAGNOSIS — B9681 Helicobacter pylori [H. pylori] as the cause of diseases classified elsewhere: Secondary | ICD-10-CM | POA: Diagnosis not present

## 2021-05-30 DIAGNOSIS — Z7689 Persons encountering health services in other specified circumstances: Secondary | ICD-10-CM | POA: Diagnosis not present

## 2021-06-01 DIAGNOSIS — D509 Iron deficiency anemia, unspecified: Secondary | ICD-10-CM | POA: Diagnosis not present

## 2021-06-26 ENCOUNTER — Inpatient Hospital Stay: Payer: Medicare Other | Admitting: Hematology & Oncology

## 2021-06-26 ENCOUNTER — Encounter: Payer: Self-pay | Admitting: Hematology & Oncology

## 2021-06-26 ENCOUNTER — Inpatient Hospital Stay: Payer: Medicare Other

## 2021-06-26 ENCOUNTER — Other Ambulatory Visit: Payer: Self-pay

## 2021-06-26 ENCOUNTER — Inpatient Hospital Stay: Payer: Medicare Other | Attending: Hematology & Oncology

## 2021-06-26 VITALS — BP 126/63 | HR 85 | Temp 97.8°F | Resp 16 | Wt 162.0 lb

## 2021-06-26 DIAGNOSIS — K909 Intestinal malabsorption, unspecified: Secondary | ICD-10-CM

## 2021-06-26 DIAGNOSIS — D500 Iron deficiency anemia secondary to blood loss (chronic): Secondary | ICD-10-CM | POA: Insufficient documentation

## 2021-06-26 DIAGNOSIS — Z79899 Other long term (current) drug therapy: Secondary | ICD-10-CM | POA: Insufficient documentation

## 2021-06-26 DIAGNOSIS — K254 Chronic or unspecified gastric ulcer with hemorrhage: Secondary | ICD-10-CM | POA: Insufficient documentation

## 2021-06-26 LAB — CMP (CANCER CENTER ONLY)
ALT: 22 U/L (ref 0–44)
AST: 21 U/L (ref 15–41)
Albumin: 4.5 g/dL (ref 3.5–5.0)
Alkaline Phosphatase: 93 U/L (ref 38–126)
Anion gap: 7 (ref 5–15)
BUN: 14 mg/dL (ref 8–23)
CO2: 29 mmol/L (ref 22–32)
Calcium: 10.2 mg/dL (ref 8.9–10.3)
Chloride: 103 mmol/L (ref 98–111)
Creatinine: 1.06 mg/dL — ABNORMAL HIGH (ref 0.44–1.00)
GFR, Estimated: 57 mL/min — ABNORMAL LOW (ref >60)
Glucose, Bld: 109 mg/dL — ABNORMAL HIGH (ref 70–99)
Potassium: 4.4 mmol/L (ref 3.5–5.1)
Sodium: 139 mmol/L (ref 135–145)
Total Bilirubin: 0.6 mg/dL (ref 0.3–1.2)
Total Protein: 7.3 g/dL (ref 6.5–8.1)

## 2021-06-26 LAB — CBC WITH DIFFERENTIAL (CANCER CENTER ONLY)
Abs Immature Granulocytes: 0.05 10*3/uL (ref 0.00–0.07)
Basophils Absolute: 0.1 10*3/uL (ref 0.0–0.1)
Basophils Relative: 1 %
Eosinophils Absolute: 0 10*3/uL (ref 0.0–0.5)
Eosinophils Relative: 1 %
HCT: 42 % (ref 36.0–46.0)
Hemoglobin: 13.5 g/dL (ref 12.0–15.0)
Immature Granulocytes: 1 %
Lymphocytes Relative: 44 %
Lymphs Abs: 2.5 10*3/uL (ref 0.7–4.0)
MCH: 27.2 pg (ref 26.0–34.0)
MCHC: 32.1 g/dL (ref 30.0–36.0)
MCV: 84.5 fL (ref 80.0–100.0)
Monocytes Absolute: 0.4 10*3/uL (ref 0.1–1.0)
Monocytes Relative: 8 %
Neutro Abs: 2.6 10*3/uL (ref 1.7–7.7)
Neutrophils Relative %: 45 %
Platelet Count: 176 10*3/uL (ref 150–400)
RBC: 4.97 MIL/uL (ref 3.87–5.11)
RDW: 23.5 % — ABNORMAL HIGH (ref 11.5–15.5)
WBC Count: 5.7 10*3/uL (ref 4.0–10.5)
nRBC: 0 % (ref 0.0–0.2)

## 2021-06-26 LAB — RETICULOCYTES
Immature Retic Fract: 12.3 % (ref 2.3–15.9)
RBC.: 5.02 MIL/uL (ref 3.87–5.11)
Retic Count, Absolute: 72.8 10*3/uL (ref 19.0–186.0)
Retic Ct Pct: 1.5 % (ref 0.4–3.1)

## 2021-06-26 NOTE — Progress Notes (Signed)
Hematology and Oncology Follow Up Visit  Christy Kelly [id number] Jan 13, 1951 70 y.o. 06/26/2021   Principle Diagnosis:  Iron deficiency anemia-history of GI bleeding  Current Therapy:   Monoferric-dose given on 05/25/2021      Interim History:  Christy Kelly is back for follow-up.  This is Christy second office visit.  She is feeling a whole lot better.  When we first saw Christy, Christy iron studies showed a ferritin of only 7 with iron saturation of 8%.  We did go ahead and give Christy a dose of IV iron with Monoferric.  She began to feel a whole lot better a week later.  She has more stamina.  She feels like she can work out the yard now.  She is very happy about how she is feeling.  Christy hemoglobin now is 13.5.  It came up 3 points with the Monoferric.  She has had no obvious bleeding.  She has had no melena or bright red blood per rectum.  Of note, when we first saw Christy, Christy B12 level was 447.  Christy appetite is good.  She is looking forward to a wonderful Thanksgiving as she fine will be able to spend it with Christy Kelly.  Is been 18 years she has been able to spend it with Christy Kelly.  Currently, I would say Christy performance status is probably ECOG 0.  Medications:  Current Outpatient Medications:    calcium carbonate (OS-CAL) 600 MG TABS tablet, Take 600 mg by mouth 2 (two) times daily with a meal., Disp: , Rfl:    Cetirizine HCl (ZYRTEC PO), Take by mouth., Disp: , Rfl:    Cholecalciferol (VITAMIN D) 50 MCG (2000 UT) CAPS, Take 1 capsule by mouth daily., Disp: , Rfl:    Coenzyme Q10 (COQ10) 100 MG CAPS, Take 1 capsule by mouth daily., Disp: , Rfl:    MAGNESIUM PO, Take 500 mg by mouth daily. , Disp: , Rfl:    melatonin 5 MG TABS, Take 5 mg by mouth at bedtime as needed., Disp: , Rfl:    Omega-3 Fatty Acids (FISH OIL) 1000 MG CAPS, Take 1,000 mg by mouth daily., Disp: , Rfl:    pantoprazole (PROTONIX) 40 MG tablet, Take 1 tablet (40 mg total) by mouth 2 (two) times daily., Disp: 180 tablet,  Rfl: 1   vitamin B-12 (CYANOCOBALAMIN) 250 MCG tablet, Take 250 mcg by mouth daily., Disp: , Rfl:   Allergies:  Allergies  Allergen Reactions   Codeine Itching and Nausea And Vomiting    Past Medical History, Surgical history, Social history, and Kelly History were reviewed and updated.  Review of Systems: Review of Systems  Constitutional: Negative.   HENT:  Negative.    Eyes: Negative.   Respiratory: Negative.    Cardiovascular: Negative.   Gastrointestinal: Negative.   Endocrine: Negative.   Genitourinary: Negative.    Musculoskeletal: Negative.   Skin: Negative.   Neurological: Negative.   Hematological: Negative.   Psychiatric/Behavioral: Negative.     Physical Exam:  weight is 162 lb (73.5 kg). Christy oral temperature is 97.8 F (36.6 C). Christy blood pressure is 126/63 and Christy pulse is 85. Christy respiration is 16 and oxygen saturation is 100%.   Wt Readings from Last 3 Encounters:  06/26/21 162 lb (73.5 kg)  05/26/21 158 lb (71.7 kg)  05/19/21 156 lb (70.8 kg)    Physical Exam Vitals reviewed.  HENT:     Head: Normocephalic and atraumatic.  Eyes:  Pupils: Pupils are equal, round, and reactive to light.  Cardiovascular:     Rate and Rhythm: Normal rate and regular rhythm.     Heart sounds: Normal heart sounds.  Pulmonary:     Effort: Pulmonary effort is normal.     Breath sounds: Normal breath sounds.  Abdominal:     General: Bowel sounds are normal.     Palpations: Abdomen is soft.  Musculoskeletal:        General: No tenderness or deformity. Normal range of motion.     Cervical back: Normal range of motion.  Lymphadenopathy:     Cervical: No cervical adenopathy.  Skin:    General: Skin is warm and dry.     Findings: No erythema or rash.  Neurological:     Mental Status: She is alert and oriented to person, place, and time.  Psychiatric:        Behavior: Behavior normal.        Thought Content: Thought content normal.        Judgment: Judgment  normal.     Lab Results  Component Value Date   WBC 5.7 06/26/2021   HGB 13.5 06/26/2021   HCT 42.0 06/26/2021   MCV 84.5 06/26/2021   PLT 176 06/26/2021     Chemistry      Component Value Date/Time   NA 139 06/26/2021 1158   NA 139 03/10/2021 0000   K 4.4 06/26/2021 1158   CL 103 06/26/2021 1158   CO2 29 06/26/2021 1158   BUN 14 06/26/2021 1158   BUN 9 03/10/2021 0000   CREATININE 1.06 (H) 06/26/2021 1158   CREATININE 1.02 04/28/2021 1402   GLU 76 03/10/2021 0000      Component Value Date/Time   CALCIUM 10.2 06/26/2021 1158   ALKPHOS 93 06/26/2021 1158   AST 21 06/26/2021 1158   ALT 22 06/26/2021 1158   BILITOT 0.6 06/26/2021 1158      Impression and Plan: Christy Kelly is a very charming 70 year old white female.  She has profound iron deficiency anemia.  She has responded incredibly well to IV iron.  I am so happy for Christy.  I am just grateful that Christy quality of life is doing better now.  We will see what Christy iron studies look like.  Christy MCV is much better.  It would not surprise me if Christy iron is still on the lower side.  I do think that we can probably just have Christy come back as needed.  Hopefully, given that the GI bleeding was transient, this should not be a problem for [place] down the road.  As always, we had great fellowship.  She has a strong faith.  She is so happy that she is going to be able to spend Thanksgiving with Christy Kelly.   Volanda Napoleon, MD 11/21/20222:14 PM

## 2021-06-27 ENCOUNTER — Telehealth: Payer: Self-pay

## 2021-06-27 LAB — IRON AND TIBC
Iron: 132 ug/dL (ref 41–142)
Saturation Ratios: 40 % (ref 21–57)
TIBC: 325 ug/dL (ref 236–444)
UIBC: 194 ug/dL (ref 120–384)

## 2021-06-27 LAB — FERRITIN: Ferritin: 109 ng/mL (ref 11–307)

## 2021-06-27 NOTE — Telephone Encounter (Signed)
Called and informed patient of lab results. She was very appreciative and declined any questions or concerns at this time.

## 2021-06-27 NOTE — Telephone Encounter (Signed)
-----   Message from Christy Napoleon, MD sent at 06/27/2021  1:22 PM EST ----- Please call and tell her the iron level is beautiful.  She does not need any IV iron.  Hopefully she will never need any more again.  Happy Thanksgiving.  [name]

## 2021-10-11 NOTE — Patient Instructions (Signed)

## 2021-10-11 NOTE — Progress Notes (Signed)
Subjective:   Christy Kelly is a 71 y.o. female who presents for Medicare Annual (Subsequent) preventive examination.  Review of Systems    I connected with  Christy Kelly on 10/12/21 by an audio only telemedicine application and verified that I am speaking with the correct person using two identifiers.   I discussed the limitations, risks, security and privacy concerns of performing an evaluation and management service by telephone and the availability of in person appointments. I also discussed with the patient that there may be a patient responsible charge related to this service. The patient expressed understanding and verbally consented to this telephonic visit.  Location of Patient: home Location of Provider: office  List any persons and their role that are participating in the visit with the patient.   Jeani Hawking Violette Lorriane Shire Malia Corsi     Objective:    There were no vitals filed for this visit. There is no height or weight on file to calculate BMI.  Advanced Directives 10/12/2021 10/11/2021 06/26/2021 05/19/2021  Does Patient Have a Medical Advance Directive? Yes Yes Yes Yes  Type of Advance Directive Living will;Healthcare Power of Attorney Living will Living will Living will  Does patient want to make changes to medical advance directive? No - Patient declined No - Patient declined No - Patient declined No - Patient declined    Current Medications (verified) Outpatient Encounter Medications as of 10/12/2021  Medication Sig   calcium carbonate (OS-CAL) 600 MG TABS tablet Take 600 mg by mouth 2 (two) times daily with a meal.   Cetirizine HCl (ZYRTEC PO) Take by mouth.   Cholecalciferol (VITAMIN D) 50 MCG (2000 UT) CAPS Take 1 capsule by mouth daily.   Coenzyme Q10 (COQ10) 100 MG CAPS Take 1 capsule by mouth daily.   MAGNESIUM PO Take 500 mg by mouth daily.    melatonin 5 MG TABS Take 5 mg by mouth at bedtime as needed.   Omega-3 Fatty Acids (FISH OIL) 1000 MG CAPS Take 1,000 mg  by mouth daily.   pantoprazole (PROTONIX) 40 MG tablet Take 1 tablet (40 mg total) by mouth 2 (two) times daily.   vitamin B-12 (CYANOCOBALAMIN) 250 MCG tablet Take 250 mcg by mouth daily.   No facility-administered encounter medications on file as of 10/12/2021.    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   Past Surgical History:  Procedure Laterality Date   BUNIONECTOMY     x2 about 2012 bilateral.    COLONOSCOPY  03/29/2021   Attending MD: Ronnette Juniper, MD; Location: [facility]  03/29/2021   Attending MD: Ronnette Juniper, MD; Location: [facility]     Family History  Problem Relation Age of Onset   Stroke Father    Early death Father    Hypertension Brother    Hypercholesterolemia Brother    Alcohol abuse Brother    Breast cancer Paternal Grandmother    Cancer Paternal Grandfather        Uncertain type- Li fraumeni gene   Colon cancer Neg Hx  Esophageal cancer Neg Hx    Rectal cancer Neg Hx    Stomach cancer Neg Hx    Social History   Socioeconomic History   Marital status: Married    Spouse name: Richardson Landry   Number of children: 2   Years of education: 13   Highest education level: Not on file  Occupational History    Comment: [employer]  Tobacco Use   Smoking status: Never   Smokeless tobacco: Never  Vaping Use   Vaping Use: Never used  Substance and Sexual Activity   Alcohol use: No   Drug use: No   Sexual activity: Yes    Partners: Male  Other Topics Concern   Not on file  Social History Narrative   Marital  status/children/pets: married, lives with husband.    Education/employment: 1 year of college-retired.  Worked as a Information systems manager.   Safety:      -smoke alarm in the home:Yes     - wears seatbelt: Yes     - Feels safe in their relationships: Yes         Social Determinants of Health   Financial Resource Strain: Low Risk    Difficulty of Paying Living Expenses: Not hard at all  Food Insecurity: No Food Insecurity   Worried About Charity fundraiser in the Last Year: Never true   Ran Out of Food in the Last Year: Never true  Transportation Needs: No Transportation Needs   Lack of Transportation (Medical): No   Lack of Transportation (Non-Medical): No  Physical Activity: Sufficiently Active   Days of Exercise per Week: 4 days   Minutes of Exercise per Session: 50 min  Stress: No Stress Concern Present   Feeling of Stress : Not at all  Social Connections: Socially Integrated   Frequency of Communication with Friends and Family: More than three times a week   Frequency of Social Gatherings with Friends and Family: Three times a week   Attends Religious Services: More than 4 times per year   Active Member of Clubs or Organizations: Yes   Attends Music therapist: More than 4 times per year   Marital Status: Married    Tobacco Counseling Counseling given: Not Answered   Clinical Intake:  Pre-visit preparation completed: Yes        Diabetes: No  How often do you need to have someone help you when you read instructions, pamphlets, or other written materials from your doctor or pharmacy?: 1 - Never  Diabetic?no  Interpreter Needed?: No      Activities of Daily Living In your present state of health, do you have any difficulty performing the following activities: 10/12/2021  Hearing? N  Vision? N  Difficulty concentrating or making decisions? N  Walking or climbing stairs? N  Dressing or bathing? N  Doing errands, shopping? N  Preparing Food  and eating ? N  Using the Toilet? N  In the past six months, have you accidently leaked urine? N  Do you have problems with loss of bowel control? N  Managing your Medications? N  Managing your Finances? N  Housekeeping or managing your Housekeeping? N  Some recent data might be hidden    Patient Care Team: Ma Hillock, DO as PCP - General (Family Medicine) Dian Queen, MD as Consulting Physician (Obstetrics and Gynecology) Satira Sark, MD as Consulting Physician (Cardiology) Leta Baptist, Earlean Polka, MD as Consulting Physician (Neurology) Ronnette Juniper, MD as Consulting Physician (  Gastroenterology)  Indicate any recent Medical Services you may have received from other than [facility] providers in the past year (date may be approximate).     Assessment:   This is a routine wellness examination for [employer].  Hearing/Vision screen No results found.  Dietary issues and exercise activities discussed: Current Exercise Habits: Structured exercise class, Type of exercise: walking, Time (Minutes): 50, Frequency (Times/Week): 4, Weekly Exercise (Minutes/Week): 200   Goals Addressed   None    Depression Screen PHQ 2/9 Scores 10/12/2021 10/11/2021 05/26/2021 05/26/2020 04/07/2019 02/13/2019  PHQ - 2 Score 0 0 0 0 0 0    Fall Risk Fall Risk  10/12/2021 10/11/2021 05/26/2021 05/26/2020  Falls in the past year? 0 0 0 0  Number falls in past yr: 0 0 0 0  Injury with Fall? 0 0 0 0  Risk for fall due to : No Fall Risks - - -  Follow up Falls evaluation completed Falls evaluation completed - Falls evaluation completed    [place]:  Any stairs in or around the home? Yes  If so, are there any without handrails? Yes  Home free of loose throw rugs in walkways, pet beds, electrical cords, etc? Yes  Adequate lighting in your home to reduce risk of falls? Yes   ASSISTIVE DEVICES UTILIZED TO PREVENT FALLS:  Life alert? No  Use of a cane, walker or w/c? No  Grab  bars in the bathroom? No  Shower chair or bench in shower? No  Elevated toilet seat or a handicapped toilet? No   TIMED UP AND GO:  Was the test performed?  n/a .  Length of time to ambulate 10 feet: n/a sec.     Cognitive Function:     6CIT Screen 10/12/2021  What Year? 0 points  What month? 0 points  What time? 0 points  Count back from 20 0 points  Months in reverse 0 points  Repeat phrase 0 points  Total Score 0    Immunizations Immunization History  Administered Date(s) Administered   Fluad Quad(high Dose 65+) 04/07/2019, 05/26/2020, 05/26/2021   Moderna Sars-Covid-2 Vaccination 10/01/2019, 10/30/2019   Pneumococcal Conjugate-13 05/26/2020   Pneumococcal Polysaccharide-23 08/23/2018   Tdap 08/31/2006   Zoster Recombinat (Shingrix) 08/23/2018, 01/23/2019    TDAP status: Due, Education has been provided regarding the importance of this vaccine. Advised may receive this vaccine at local pharmacy or Health Dept. Aware to provide a copy of the vaccination record if obtained from local pharmacy or Health Dept. Verbalized acceptance and understanding.  Flu Vaccine status: Up to date  Pneumococcal vaccine status: Up to date  Covid-19 vaccine status: Information provided on how to obtain vaccines.   Qualifies for Shingles Vaccine? Yes   Zostavax completed No   Shingrix Completed?: Yes  Screening Tests Health Maintenance  Topic Date Due   COVID-19 Vaccine (3 - Moderna risk series) 11/27/2019   TETANUS/TDAP  03/14/2022 (Originally 08/06/2017)   MAMMOGRAM  05/12/2023   COLONOSCOPY (Pts 45-47yr Insurance coverage will need to be confirmed)  03/29/2024   Pneumonia Vaccine 69 Years old  Completed   INFLUENZA VACCINE  Completed   DEXA SCAN  Completed   Hepatitis C Screening  Completed   Zoster Vaccines- Shingrix  Completed   HPV VACCINES  Aged Out    Health Maintenance  Health Maintenance Due  Topic Date Due   COVID-19 Vaccine (3 - Moderna risk series) 11/27/2019     Colorectal cancer screening: Type  of screening: Colonoscopy. Completed 03/29/2021. Repeat every 3 years  Mammogram status: Completed 05/11/2021. Repeat every year  Bone Density status: Completed 08/15/2018. Results reflect: Bone density results: OSTEOPENIA. Repeat every n/a years.  Lung Cancer Screening: (Low Dose CT Chest recommended if Age 44-80 years, 30 pack-year currently smoking OR have quit w/in 15years.) does not qualify.   Lung Cancer Screening Referral: n/a  Additional Screening:  Hepatitis C Screening: does qualify; Completed 03/10/2021  Vision Screening: Recommended annual ophthalmology exams for early detection of glaucoma and other disorders of the eye. Is the patient up to date with their annual eye exam?  Yes  Who is the provider or what is the name of the office in which the patient attends annual eye exams? My eye doctor in [place], [place] If pt is not established with a provider, would they like to be referred to a provider to establish care? No .   Dental Screening: Recommended annual dental exams for proper oral hygiene  Community Resource Referral / Chronic Care Management: CRR required this visit?  No   CCM required this visit?  No      Plan:     I have personally reviewed and noted the following in the patients chart:   Medical and social history Use of alcohol, tobacco or illicit drugs  Current medications and supplements including opioid prescriptions.  Functional ability and status Nutritional status Physical activity Advanced directives List of other physicians Hospitalizations, surgeries, and ER visits in previous 12 months Vitals Screenings to include cognitive, depression, and falls Referrals and appointments  In addition, I have reviewed and discussed with patient certain preventive protocols, quality metrics, and best practice recommendations. A written personalized care plan for preventive services as well as general preventive health  recommendations were provided to patient.     Octaviano Glow, CMA   10/12/2021   Nurse Notes: Non-Face to Face or Face to Face 10 minute visit Encounter   Ms. Imhoff , Thank you for taking time to come for your Medicare Wellness Visit. I appreciate your ongoing commitment to your health goals. Please review the following plan we discussed and let me know if I can assist you in the future.   These are the goals we discussed:  Goals   None     This is a list of the screening recommended for you and due dates:  Health Maintenance  Topic Date Due   COVID-19 Vaccine (3 - Moderna risk series) 11/27/2019   Tetanus Vaccine  03/14/2022*   Mammogram  05/12/2023   Colon Cancer Screening  03/29/2024   Pneumonia Vaccine  Completed   Flu Shot  Completed   DEXA scan (bone density measurement)  Completed   Hepatitis C Screening: USPSTF Recommendation to screen - Ages 15-79 yo.  Completed   Zoster (Shingles) Vaccine  Completed   HPV Vaccine  Aged Out  *Topic was postponed. The date shown is not the original due date.

## 2021-10-12 ENCOUNTER — Other Ambulatory Visit: Payer: Self-pay

## 2021-10-12 ENCOUNTER — Ambulatory Visit (INDEPENDENT_AMBULATORY_CARE_PROVIDER_SITE_OTHER): Payer: Medicare Other

## 2021-10-12 DIAGNOSIS — Z0000 Encounter for general adult medical examination without abnormal findings: Secondary | ICD-10-CM

## 2021-10-13 DIAGNOSIS — K5710 Diverticulosis of small intestine without perforation or abscess without bleeding: Secondary | ICD-10-CM | POA: Insufficient documentation

## 2021-10-13 DIAGNOSIS — K219 Gastro-esophageal reflux disease without esophagitis: Secondary | ICD-10-CM | POA: Insufficient documentation

## 2021-10-13 DIAGNOSIS — K259 Gastric ulcer, unspecified as acute or chronic, without hemorrhage or perforation: Secondary | ICD-10-CM | POA: Insufficient documentation

## 2021-10-13 DIAGNOSIS — K2930 Chronic superficial gastritis without bleeding: Secondary | ICD-10-CM | POA: Insufficient documentation

## 2021-10-13 DIAGNOSIS — K5730 Diverticulosis of large intestine without perforation or abscess without bleeding: Secondary | ICD-10-CM | POA: Insufficient documentation

## 2021-10-16 ENCOUNTER — Other Ambulatory Visit: Payer: Self-pay

## 2021-10-16 ENCOUNTER — Telehealth (INDEPENDENT_AMBULATORY_CARE_PROVIDER_SITE_OTHER): Payer: Medicare Other | Admitting: Family Medicine

## 2021-10-16 ENCOUNTER — Encounter: Payer: Self-pay | Admitting: Family Medicine

## 2021-10-16 DIAGNOSIS — K219 Gastro-esophageal reflux disease without esophagitis: Secondary | ICD-10-CM | POA: Diagnosis not present

## 2021-10-16 DIAGNOSIS — K279 Peptic ulcer, site unspecified, unspecified as acute or chronic, without hemorrhage or perforation: Secondary | ICD-10-CM

## 2021-10-16 DIAGNOSIS — H02729 Madarosis of unspecified eye, unspecified eyelid and periocular area: Secondary | ICD-10-CM | POA: Insufficient documentation

## 2021-10-16 DIAGNOSIS — G629 Polyneuropathy, unspecified: Secondary | ICD-10-CM

## 2021-10-16 DIAGNOSIS — M5136 Other intervertebral disc degeneration, lumbar region: Secondary | ICD-10-CM

## 2021-10-16 MED ORDER — PANTOPRAZOLE SODIUM 40 MG PO TBEC: 40.0000 mg | DELAYED_RELEASE_TABLET | Freq: Every day | ORAL | 1 refills | Status: DC

## 2021-10-16 MED ORDER — BIMATOPROST 0.03 % EX SOLN: CUTANEOUS | 12 refills | Status: DC

## 2021-10-16 NOTE — Progress Notes (Signed)
? ? ? ? ?VIRTUAL VISIT VIA VIDEO ? ?I connected with Christy Kelly on 10/16/21 at  1:40 PM EDT by a video enabled telemedicine application and verified that I am speaking with the correct person using two identifiers. ?Location patient: Home ?Location provider: [facility], Office ?Persons participating in the virtual visit: Patient, Dr. Raoul Kelly and Christy Kelly, [place] ? ?I discussed the limitations of evaluation and management by telemedicine and the availability of in person appointments. The patient expressed understanding and agreed to proceed. ? ? ?Christy Kelly , Jul 27, 1951, 71 y.o., female ?MRN: [id number] ?Patient Care Team  ?  Relationship Specialty Notifications Start End  ?Christy Hillock, DO PCP - General Family Medicine  03/14/21   ?Christy Queen, MD Consulting Physician Obstetrics and Gynecology  02/16/19   ?Christy Sark, MD Consulting Physician Cardiology  04/07/19   ?Christy Bombard, MD Consulting Physician Neurology  04/07/19   ?Christy Juniper, MD Consulting Physician Gastroenterology  03/14/21   ? ? ?Chief Complaint  ?Patient presents with  ? eyelash  ?  Pt would like to have serum for eyelash   ? ?  ?Subjective: Pt presents for an OV with complaints ability to grow eyelashes.  She has tried many over-the-counter products without great resolution.  She reports she used the product for 3 months nightly and does not see any change in her eyelashes. ? ?Reflux: Is improving, she is now down to 1 tab a Protonix daily. ? ?Depression screen [facility] 2/9 10/12/2021 10/11/2021 05/26/2021 05/26/2020 04/07/2019  ?Decreased Interest 0 0 0 0 0  ?Down, Depressed, Hopeless 0 0 0 0 0  ?PHQ - 2 Score 0 0 0 0 0  ? ? ?Allergies  ?Allergen Reactions  ? Codeine Itching and Nausea And Vomiting  ? Hydrocodone   ?  Other reaction(s): nausea  ? ?Social History  ? ?Social History Narrative  ? Marital status/children/pets: married, lives with husband.   ? Education/employment: 1 year of college-retired.  Worked as a Engineer, maintenance.  ? Safety:   ?   -smoke alarm in the home:Yes  ?   - wears seatbelt: Yes  ?   - Feels safe in their relationships: Yes  ?   ?   ? ?Past Medical History:  ?Diagnosis Date  ? Allergy   ? Chicken pox   ? Goals of care, counseling/discussion 05/19/2021  ? Hyperlipidemia   ? 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 Menopausal state   ? 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 Primary exertional headache 09/05/2015  ? Scoliosis   ? Minimal levoconvex lumbar rotary scoliosis.  ? ?Past Surgical History:  ?Procedure Laterality Date  ? BUNIONECTOMY    ? x2 about 2012 bilateral.   ? COLONOSCOPY  03/29/2021  ? Attending MD: Christy Juniper, MD; Location: [facility]  ? HYSTEROSCOPY  2001  ? TONSILLECTOMY  1962  ? UPPER GI ENDOSCOPY  03/29/2021  ? Attending MD: Christy Juniper, MD; Location: [facility]  ? WISDOM TOOTH EXTRACTION    ? ?Family History  ?Problem Relation Age of Onset  ? Stroke Father   ? Early death Father   ? Hypertension Brother   ? Hypercholesterolemia Brother   ? Alcohol abuse Brother   ? Breast cancer Paternal Grandmother   ? Cancer Paternal Grandfather   ?     Uncertain type- Li fraumeni gene  ? Colon cancer Neg Hx   ? Esophageal cancer Neg Hx   ? Rectal cancer Neg Hx   ?  Stomach cancer Neg Hx   ? ?Allergies as of 10/16/2021   ? ?   Reactions  ? Codeine Itching, Nausea And Vomiting  ? Hydrocodone   ? Other reaction(s): nausea  ? ?  ? ?  ?Medication List  ?  ? ?  ? Accurate as of October 16, 2021  2:04 PM. If you have any questions, ask your nurse or doctor.  ?  ?  ? ?  ? ?bimatoprost 0.03 % ophthalmic solution ?Commonly known as: LATISSE ?Place one drop on applicator and apply evenly along the skin of the upper eyelid at base of eyelashes once daily at bedtime; repeat procedure for second eye (use a clean applicator). ?Started by: Christy Pouch, DO ?  ?calcium carbonate 600 MG Tabs tablet ?Commonly known as: OS-CAL ?Take 600 mg by mouth 2 (two) times daily with a meal. ?  ?CoQ10 100 MG Caps ?Take 1 capsule  by mouth daily. ?  ?Fish Oil 1000 MG Caps ?Take 1,000 mg by mouth daily. ?  ?MAGNESIUM PO ?Take 500 mg by mouth daily. ?  ?melatonin 5 MG Tabs ?Take 5 mg by mouth at bedtime as needed. ?  ?pantoprazole 40 MG tablet ?Commonly known as: PROTONIX ?Take 1 tablet (40 mg total) by mouth 2 (two) times daily. ?  ?vitamin B-12 250 MCG tablet ?Commonly known as: CYANOCOBALAMIN ?Take 250 mcg by mouth daily. ?  ?Vitamin D 50 MCG (2000 UT) Caps ?Take 1 capsule by mouth daily. ?  ?ZYRTEC PO ?Take by mouth. ?  ? ?  ? ? ?All past medical history, surgical history, allergies, family history, immunizations andmedications were updated in the EMR today and reviewed under the history and medication portions of their EMR.    ? ?ROS ?Negative, with the exception of above mentioned in HPI ? ? ?Objective:  ?There were no vitals taken for this visit. ?There is no height or weight on file to calculate BMI. ? ?Physical Exam ?Gen: Afebrile. No acute distress. Nontoxic in appearance, well developed, well nourished.  ?HENT: AT. [place].  ?Eyes:Pupils Equal Round Reactive to light, Extraocular movements intact,  Conjunctiva without redness, discharge or icterus. ?Neuro: Alert. Oriented x3  ?Psych: Normal affect, dress and demeanor. Normal speech. Normal thought content and judgment. ? ?No results found. ?No results found. ?No results found for this or any previous visit (from the past 24 hour(s)). ? ?Assessment/Plan: ?[name] is a 71 y.o. female present for OV for  ?Neuropathy/DDD (degenerative disc disease), lumbar ?Today she reports symptoms have remained and may be getting a little worse when laying flat.  I have encouraged her to make an appointment in person for further evaluation on his condition.  Since we would have to consider repeating labs and performing new evaluation and ordering MRI. ? ?Gastroesophageal reflux disease without esophagitis/PUD (peptic ulcer disease) ?She is been able to taper down to 1 tab daily of the Protonix.   Continue 1 tab daily for at least another 3 months and then we can try to see if she is able to taper off medication or at least go every other day. ? ?Hypotrichosis of eyelid, unspecified laterality ?Prescribed Latisse nightly application bilateral eyelashes.  Patient understands this will not be covered by her insurance. ? ?Reviewed expectations re: course of current medical issues. ?Discussed self-management of symptoms. ?Outlined signs and symptoms indicating need for more acute intervention. ?Patient verbalized understanding and all questions were answered. ?Patient received an After-Visit Summary. ? ? ? ?No orders of the defined  types were placed in this encounter. ? ?Meds ordered this encounter  ?Medications  ? bimatoprost (LATISSE) 0.03 % ophthalmic solution  ?  Sig: Place one drop on applicator and apply evenly along the skin of the upper eyelid at base of eyelashes once daily at bedtime; repeat procedure for second eye (use a clean applicator).  ?  Dispense:  3 mL  ?  Refill:  12  ? ?Referral Orders  ?No referral(s) requested today  ? ? ? ?Note is dictated utilizing voice recognition software. Although note has been proof read prior to signing, occasional typographical errors still can be missed. If any questions arise, please do not hesitate to call for verification.  ? ?electronically signed by: ? ?Christy Pouch, DO  ?[facility] [place] ? ? ? ?

## 2021-10-18 ENCOUNTER — Telehealth: Payer: Self-pay

## 2021-10-18 NOTE — Telephone Encounter (Signed)
FYI ? ?Patient calling to let Dr. Raoul Pitch know the cost of medication. ? ?Bimatoprost opthalmic solution .03%  =  $45.13 ? ?bimatoprost (LATISSE) 0.03 % ophthalmic solution [[id number]]  ? ?

## 2021-10-19 NOTE — Telephone Encounter (Signed)
FYI

## 2022-01-17 ENCOUNTER — Ambulatory Visit (INDEPENDENT_AMBULATORY_CARE_PROVIDER_SITE_OTHER): Payer: Medicare Other | Admitting: Family Medicine

## 2022-01-17 ENCOUNTER — Encounter: Payer: Self-pay | Admitting: Family Medicine

## 2022-01-17 VITALS — BP 114/78 | HR 84 | Temp 98.0°F | Ht 68.25 in | Wt 160.0 lb

## 2022-01-17 DIAGNOSIS — M5416 Radiculopathy, lumbar region: Secondary | ICD-10-CM | POA: Diagnosis not present

## 2022-01-17 DIAGNOSIS — I872 Venous insufficiency (chronic) (peripheral): Secondary | ICD-10-CM

## 2022-01-17 DIAGNOSIS — G2581 Restless legs syndrome: Secondary | ICD-10-CM

## 2022-01-17 MED ORDER — PREGABALIN 50 MG PO CAPS: 50.0000 mg | ORAL_CAPSULE | Freq: Every day | ORAL | 1 refills | Status: DC

## 2022-01-17 NOTE — Patient Instructions (Signed)
Chronic Venous Insufficiency Chronic venous insufficiency is a condition where the leg veins cannot effectively pump blood from the legs to the heart. This happens when the vein walls are either stretched, weakened, or damaged, or when the valves inside the vein are damaged. With the right treatment, you should be able to continue with an active life. This condition is also called venous stasis. What are the causes? Common causes of this condition include: High blood pressure inside the veins (venous hypertension). Sitting or standing too long, causing increased blood pressure in the leg veins. A blood clot that blocks blood flow in a vein (deep vein thrombosis, DVT). Inflammation of a vein (phlebitis) that causes a blood clot to form. Tumors in the pelvis that cause blood to back up. What increases the risk? The following factors may make you more likely to develop this condition: Having a family history of this condition. Obesity. Pregnancy. Living without enough regular physical activity or exercise (sedentary lifestyle). Smoking. Having a job that requires long periods of standing or sitting in one place. Being a certain age. Women in their 40s and 50s and men in their 70s are more likely to develop this condition. What are the signs or symptoms? Symptoms of this condition include: Veins that are enlarged, bulging, or twisted (varicose veins). Skin breakdown or ulcers. Reddened skin or dark discoloration of skin on the leg between the knee and ankle. Brown, smooth, tight, and painful skin just above the ankle, usually on the inside of the leg (lipodermatosclerosis). Swelling of the legs. How is this diagnosed? This condition may be diagnosed based on: Your medical history. A physical exam. Tests, such as: A procedure that creates an image of a blood vessel and nearby organs and provides information about blood flow through the blood vessel (duplex ultrasound). A procedure that  tests blood flow (plethysmography). A procedure that looks at the veins using X-ray and dye (venogram). How is this treated? The goals of treatment are to help you return to an active life and to minimize pain or disability. Treatment depends on the severity of your condition, and it may include: Wearing compression stockings. These can help relieve symptoms and help prevent your condition from getting worse. However, they do not cure the condition. Sclerotherapy. This procedure involves an injection of a solution that shrinks damaged veins. Surgery. This may involve: Removing a diseased vein (vein stripping). Cutting off blood flow through the vein (laser ablation surgery). Repairing or reconstructing a valve within the affected vein. Follow these instructions at home:     Wear compression stockings as told by your health care provider. These stockings help to prevent blood clots and reduce swelling in your legs. Take over-the-counter and prescription medicines only as told by your health care provider. Stay active by exercising, walking, or doing different activities. Ask your health care provider what activities are safe for you and how much exercise you need. Drink enough fluid to keep your urine pale yellow. Do not use any products that contain nicotine or tobacco, such as cigarettes, e-cigarettes, and chewing tobacco. If you need help quitting, ask your health care provider. Keep all follow-up visits as told by your health care provider. This is important. Contact a health care provider if you: Have redness, swelling, or more pain in the affected area. See a red streak or line that goes up or down from the affected area. Have skin breakdown or skin loss in the affected area, even if the breakdown is small. Get   an injury in the affected area. Get help right away if: You get an injury and an open wound in the affected area. You have: Severe pain that does not get better with  medicine. Sudden numbness or weakness in the foot or ankle below the affected area. Trouble moving your foot or ankle. A fever. Worse or persistent symptoms. Chest pain. Shortness of breath. Summary Chronic venous insufficiency is a condition where the leg veins cannot effectively pump blood from the legs to the heart. Chronic venous insufficiency occurs when the vein walls become stretched, weakened, or damaged, or when valves within the vein are damaged. Treatment depends on how severe your condition is. It often involves wearing compression stockings and may involve having a procedure. Make sure you stay active by exercising, walking, or doing different activities. Ask your health care provider what activities are safe for you and how much exercise you need. This information is not intended to replace advice given to you by your health care provider. Make sure you discuss any questions you have with your health care provider. Document Revised: 10/04/2020 Document Reviewed: 10/04/2020 Elsevier Patient Education  2023 [employer] Inc.  

## 2022-01-17 NOTE — Progress Notes (Signed)
Christy Kelly , 1951/01/05, 71 y.o., female MRN: [id number] Patient Care Team    Relationship Specialty Notifications Start End  Ma Hillock, DO PCP - General Family Medicine  03/14/21   Dian Queen, MD Consulting Physician Obstetrics and Gynecology  02/16/19   Satira Sark, MD Consulting Physician Cardiology  04/07/19   Penni Bombard, MD Consulting Physician Neurology  04/07/19   Ronnette Juniper, MD Consulting Physician Gastroenterology  03/14/21     Chief Complaint  Patient presents with   Foot Swelling    Pt c/o b/l feet swelling R>L, worsen at night, burning, numbness worsen in the last 6 mos;      Subjective: Pt presents for an OV with complaints of bilateral lower extremity swelling that has been worse over the last couple weeks.  She states it is worse at the end of the day after standing on her feet.  It resolves when she lifts her legs and by the time she wakes in the morning.  She also reports the neuropathy that occurs when she is laying flat on her back seems to be getting a little worse.  She has elected not to pursue imaging or referrals for this in the past.  She would like to consider medication if possible to help.     10/12/2021    1:26 PM 10/11/2021    1:49 PM 05/26/2021    9:55 AM 05/26/2020   10:50 AM 04/07/2019   11:07 AM  Depression screen PHQ 2/9  Decreased Interest 0 0 0 0 0  Down, Depressed, Hopeless 0 0 0 0 0  PHQ - 2 Score 0 0 0 0 0    Allergies  Allergen Reactions   Codeine Itching and Nausea And Vomiting   Hydrocodone     Other reaction(s): nausea   Social History   Social History Narrative   Marital status/children/pets: married, lives with husband.    Education/employment: 1 year of college-retired.  Worked as a Information systems manager.   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   Past Surgical History:  Procedure Laterality Date   BUNIONECTOMY     x2 about 2012 bilateral.    COLONOSCOPY  03/29/2021   Attending MD: Ronnette Juniper, MD; Location: [facility]  03/29/2021   Attending MD: Ronnette Juniper, MD; Location: [facility]     Family History  Problem Relation Age of Onset   Stroke Father    Early death Father    Hypertension Brother    Hypercholesterolemia Brother    Alcohol abuse Brother    Breast cancer Paternal Grandmother    Cancer Paternal Grandfather        Uncertain type- Li fraumeni gene   Colon cancer Neg Hx    Esophageal cancer Neg Hx    Rectal cancer Neg Hx  Stomach cancer Neg Hx    Allergies as of 01/17/2022       Reactions   Codeine Itching, Nausea And Vomiting   Hydrocodone    Other reaction(s): nausea        Medication List        Accurate as of January 17, 2022  3:41 PM. If you have any questions, ask your nurse or doctor.          STOP taking these medications    CoQ10 100 MG Caps Stopped by: Howard Pouch, DO       TAKE these medications    Alpha-Lipoic Acid 200 MG Caps Take by mouth.   bimatoprost 0.03 % ophthalmic solution Commonly known as: LATISSE Place one drop on applicator and apply evenly along the skin of the upper eyelid at base of eyelashes once daily at bedtime; repeat procedure for second eye (use a clean applicator).   calcium carbonate 600 MG Tabs tablet Commonly known as: OS-CAL Take 600 mg by mouth 2 (two) times daily with a meal.   Fish Oil 1000 MG Caps Take 1,000 mg by mouth daily.   MAGNESIUM PO Take 500 mg by mouth daily.    melatonin 5 MG Tabs Take 5 mg by mouth at bedtime as needed.   pantoprazole 40 MG tablet Commonly known as: PROTONIX Take 1 tablet (40 mg total) by mouth daily.   pregabalin 50 MG capsule Commonly known as: Lyrica Take 1 capsule (50 mg total) by mouth at bedtime. Started by: Howard Pouch, DO   psyllium 58.6 % packet Commonly known as: METAMUCIL Take 1 packet by mouth daily.   vitamin B-12 250 MCG tablet Commonly known as: CYANOCOBALAMIN Take 250 mcg by mouth daily.   Vitamin D 50 MCG (2000 UT) Caps Take 1 capsule by mouth daily.   ZYRTEC PO Take by mouth.        All past medical history, surgical history, allergies, family history, immunizations andmedications were updated in the EMR today and reviewed under the history and medication portions of their EMR.     ROS Negative, with the exception of above mentioned in HPI   Objective:  BP 114/78   Pulse 84   Temp 98 F (36.7 C) (Oral)   Ht 5' 8.25" (1.734 m)   Wt 160 lb (72.6 kg)   SpO2 95%   BMI 24.15 kg/m  Body mass index is 24.15 kg/m. Physical Exam Vitals and nursing note reviewed.  Constitutional:      General: She is not in acute distress.    Appearance: Normal appearance. She is normal weight. She is not ill-appearing or toxic-appearing.  HENT:     Head: Normocephalic and atraumatic.  Eyes:     Extraocular Movements: Extraocular movements intact.     Conjunctiva/sclera: Conjunctivae normal.     Pupils: Pupils are equal, round, and reactive to light.  Musculoskeletal:     Right lower leg: Edema (Trace) present.     Left lower leg: Edema (Trace) present.  Neurological:     Mental Status: She is alert and oriented to person, place, and time. Mental status is at baseline.  Psychiatric:        Mood and Affect: Mood normal.        Behavior: Behavior normal.        Thought Content: Thought content normal.        Judgment: Judgment normal.     No results found. No results found. No results found  for  this or any previous visit (from the past 24 hour(s)).  Assessment/Plan: Christy Kelly is a 71 y.o. female present for OV for  Venous insufficiency Discussed low-sodium diet. Elevation of legs when able. Compression stockings.  Restless leg/Lumbar radiculopathy She has declined further imaging studies or referrals for her radiculopathy.  Worse when laying flat. Also has restless leg features. Start Lyrica 50 mg nightly.  She will call in 4 weeks if desires tapering up to next dose.  Otherwise follow-up in 5.5 months  Reviewed expectations re: course of current medical issues. Discussed self-management of symptoms. Outlined signs and symptoms indicating need for more acute intervention. Patient verbalized understanding and all questions were answered. Patient received an After-Visit Summary.    No orders of the defined types were placed in this encounter.  Meds ordered this encounter  Medications   pregabalin (LYRICA) 50 MG capsule    Sig: Take 1 capsule (50 mg total) by mouth at bedtime.    Dispense:  90 capsule    Refill:  1   Referral Orders  No referral(s) requested today     Note is dictated utilizing voice recognition software. Although note has been proof read prior to signing, occasional typographical errors still can be missed. If any questions arise, please do not hesitate to call for verification.   electronically signed by:  Howard Pouch, DO  [place]

## 2022-03-01 DIAGNOSIS — L812 Freckles: Secondary | ICD-10-CM | POA: Diagnosis not present

## 2022-03-01 DIAGNOSIS — L821 Other seborrheic keratosis: Secondary | ICD-10-CM | POA: Diagnosis not present

## 2022-03-01 DIAGNOSIS — L738 Other specified follicular disorders: Secondary | ICD-10-CM | POA: Diagnosis not present

## 2022-03-01 DIAGNOSIS — D1801 Hemangioma of skin and subcutaneous tissue: Secondary | ICD-10-CM | POA: Diagnosis not present

## 2022-03-29 ENCOUNTER — Telehealth: Payer: Self-pay

## 2022-03-29 NOTE — Telephone Encounter (Signed)
Pt has provider appt 8/25  [facility] - Client TELEPHONE ADVICE RECORD [facility] Patient Name: Christy Kelly Gender: Female DOB: 1951/06/28 Age: 71 Y 1 D Return Phone Number: [phone] (Primary) Address: City/ State/ Zip: [place] [address] Client [facility] - Client Client Site [place] - Day Provider Raoul Pitch, [place] Type Call Who Is Calling Patient / Member / Family / Caregiver Call Type Triage / Clinical Relationship To Patient Self Return Phone Number [phone] (Primary) Chief Complaint WHEEZING Reason for Call Symptomatic / Request for Health Information Initial Comment Caller states cough getting worse; breathing sounds like a purr; Translation No Nurse Assessment Nurse: D'Heur Lucia Gaskins, RN, [place]rien[place] Date/Time (Eastern Time): 03/27/2022 8:32:59 AM Confirm and document reason for call. If symptomatic, describe symptoms. ---Caller states she has an intermittent cough (for past two days) that is getting worse; breathing sounds like a purr "sporadically like there may be phlegm"; she can feel it when she puts her hand on her chest. No fever, no congestion. Her chest is burning from coughing. Does the patient have any new or worsening symptoms? ---Yes Will a triage be completed? ---Yes Related visit to physician within the last 2 weeks? ---No Does the PT have any chronic conditions? (i.e. diabetes, asthma, this includes High risk factors for pregnancy, etc.) ---Yes List chronic conditions. ---hx of ulcers, h. pylori, hx of Lymes, ulcers. Is this a behavioral health or substance abuse call? ---No Guidelines Guideline Title Affirmed Question Affirmed Notes Nurse Date/Time Eilene Ghazi Time) Cough - Acute Productive Wheezing is present [place], RN, [place] 03/27/2022 8:37:23 AM Disp. Time Eilene Ghazi Time) Disposition Final User 03/27/2022 8:31:30 AM Send to Urgent Queue Jerrye Beavers 03/27/2022 8:42:23 AM See HCP within 4 Hours (or PCP triage) Yes D'Heur Lucia Gaskins, RN, [place]rienne PLEASE NOTE: All timestamps contained within this report are represented as [place] Standard Time. CONFIDENTIALTY NOTICE: This fax transmission is intended only for the addressee. It contains information that is legally privileged, confidential or otherwise protected from use or disclosure. If you are not the intended recipient, you are strictly prohibited from reviewing, disclosing, copying using or disseminating any of this information or taking any action in reliance on or regarding this information. If you have received this fax in error, please notify us immediately by telephone so that we can arrange for its return to [place]. Phone: [phone], Toll-Free: [phone], Fax: [phone] Page: 2 of 2 Call Id: [id number] Final Disposition 03/27/2022 8:42:23 AM See HCP within 4 Hours (or PCP triage) Yes D'Heur Lucia Gaskins, RN, [place]bbie [place]atus Disagree/Comply Comply Caller Understands Yes PreDisposition Call Doctor Care Advice Given Per Guideline SEE HCP (OR PCP TRIAGE) WITHIN 4 HOURS: * IF OFFICE WILL BE OPEN: You need to be seen within the next 3 or 4 hours. Call your doctor (or NP/PA) now or as soon as the office opens. * HOME REMEDY - HONEY: This old home remedy has been shown to help decrease coughing at night. The adult dosage is 2 teaspoons (10 ml) at bedtime. CALL BACK IF: * You become worse CARE ADVICE given per Cough - Acute Productive (Adult) guideline. Comments User: Jerrye Beavers Date/Time Eilene Ghazi Time): 03/27/2022 8:28:57 AM Info provided by Murray Hodgkins; User: Vincente Liberty, D'Heur Lucia Gaskins, RN Date/Time Eilene Ghazi Time): 03/27/2022 8:39:50 AM Caller states she has been exercising in her pool in the evenings. Two days ago, she noticed working harder to breathe. User: Vincente Liberty, D'Heur Lucia Gaskins, RN Date/Time Eilene Ghazi Time): 03/27/2022 8:46:57 AM Office called as  per directives. Referrals REFERRED TO  PCP OFFICE

## 2022-03-30 ENCOUNTER — Ambulatory Visit (INDEPENDENT_AMBULATORY_CARE_PROVIDER_SITE_OTHER): Payer: Medicare Other | Admitting: Family Medicine

## 2022-03-30 ENCOUNTER — Encounter: Payer: Self-pay | Admitting: Family Medicine

## 2022-03-30 ENCOUNTER — Encounter: Payer: Self-pay | Admitting: Hematology & Oncology

## 2022-03-30 VITALS — BP 116/80 | HR 91 | Temp 98.3°F | Ht 68.5 in | Wt 158.0 lb

## 2022-03-30 DIAGNOSIS — Z8742 Personal history of other diseases of the female genital tract: Secondary | ICD-10-CM

## 2022-03-30 DIAGNOSIS — J189 Pneumonia, unspecified organism: Secondary | ICD-10-CM

## 2022-03-30 LAB — CBC WITH DIFFERENTIAL/PLATELET
Basophils Absolute: 0 10*3/uL (ref 0.0–0.2)
Basos: 1 %
EOS (ABSOLUTE): 0 10*3/uL (ref 0.0–0.4)
Eos: 0 %
Hematocrit: 44.4 % (ref 34.0–46.6)
Hemoglobin: 14.6 g/dL (ref 11.1–15.9)
Immature Grans (Abs): 0 10*3/uL (ref 0.0–0.1)
Immature Granulocytes: 0 %
Lymphocytes Absolute: 1.6 10*3/uL (ref 0.7–3.1)
Lymphs: 31 %
MCH: 30.4 pg (ref 26.6–33.0)
MCHC: 32.9 g/dL (ref 31.5–35.7)
MCV: 92 fL (ref 79–97)
Monocytes Absolute: 0.8 10*3/uL (ref 0.1–0.9)
Monocytes: 15 %
Neutrophils Absolute: 2.6 10*3/uL (ref 1.4–7.0)
Neutrophils: 53 %
Platelets: 175 10*3/uL (ref 150–450)
RBC: 4.81 x10E6/uL (ref 3.77–5.28)
RDW: 12.8 % (ref 11.7–15.4)
WBC: 5 10*3/uL (ref 3.4–10.8)

## 2022-03-30 MED ORDER — FLUCONAZOLE 150 MG PO TABS: 150.0000 mg | ORAL_TABLET | Freq: Once | ORAL | 0 refills | Status: AC

## 2022-03-30 MED ORDER — PREDNISONE 20 MG PO TABS: ORAL_TABLET | ORAL | 0 refills | Status: DC

## 2022-03-30 MED ORDER — AMOXICILLIN-POT CLAVULANATE 875-125 MG PO TABS
1.0000 | ORAL_TABLET | Freq: Two times a day (BID) | ORAL | 0 refills | Status: AC
Start: 2022-03-30 — End: 2022-04-06

## 2022-03-30 MED ORDER — AZITHROMYCIN 250 MG PO TABS: ORAL_TABLET | ORAL | 0 refills | Status: DC

## 2022-03-30 MED ORDER — ALBUTEROL SULFATE HFA 108 (90 BASE) MCG/ACT IN AERS: 2.0000 | INHALATION_SPRAY | Freq: Four times a day (QID) | RESPIRATORY_TRACT | 2 refills | Status: DC | PRN

## 2022-03-30 NOTE — Progress Notes (Signed)
Subjective:  Patient ID: Christy Kelly, female    DOB: [birth year], 71 y.o.   MRN: [id number]  Patient Care Team: Baruch Gouty, FNP as PCP - General (Family Medicine) Dian Queen, MD as Consulting Physician (Obstetrics and Gynecology) Satira Sark, MD as Consulting Physician (Cardiology) Penni Bombard, MD as Consulting Physician (Neurology) Ronnette Juniper, MD as Consulting Physician (Gastroenterology)   Chief Complaint:  New Patient (Initial Visit) (Cough since Sunday/Sore throat x2 days/Feeling SOB)   HPI: Christy Kelly is a 71 y.o. female presenting on 03/30/2022 for New Patient (Initial Visit) (Cough since Sunday/Sore throat x2 days/Feeling SOB)   Pt presents today to establish care and for evaluation of worsening cough, congestion, wheezing, and shortness of breath. Reports this all started last Tuesday. She tried to get in to see her PCP and was told they did not have any available appointments. Did not want to go to ED or UC for treatment. Has had brown colored sputum production, wheezing, and low grade fever, states she just feels worn out and weak.   URI  This is a new problem. The current episode started 1 to 4 weeks ago. The problem has been gradually worsening. The maximum temperature recorded prior to her arrival was 100.4 - 100.9 F. Associated symptoms include congestion, coughing and wheezing. Pertinent negatives include no abdominal pain, chest pain, diarrhea, dysuria, ear pain, headaches, joint pain, joint swelling, nausea, neck pain, plugged ear sensation, rash, rhinorrhea, sinus pain, sneezing, sore throat, swollen glands or vomiting. She has tried decongestant for the symptoms. The treatment provided no relief.    Relevant past medical, surgical, family, and social history reviewed and updated as indicated.  Allergies and medications reviewed and updated. Data reviewed: Chart in Epic.   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    Past Surgical History:  Procedure Laterality Date   BUNIONECTOMY     x2 about 2012 bilateral.    COLONOSCOPY  03/29/2021   Attending MD: Ronnette Juniper, MD; Location: [facility]  03/29/2021   Attending MD: Ronnette Juniper, MD; Location: [place] [facility]      Social History   Socioeconomic History   Marital status: Married    Spouse name: Richardson Landry   Number of children: 2   Years of education: 13   Highest education level: Some college, no degree  Occupational History    Comment: [employer]  Tobacco Use   Smoking status: Never   Smokeless tobacco: Never  Vaping Use   Vaping Use: Never used  Substance and Sexual Activity   Alcohol use: No   Drug use: No   Sexual activity: Yes    Partners: Male  Other Topics Concern   Not on file  Social History Narrative   Marital status/children/pets: married, lives with husband.    Education/employment: 1 year of college-retired.  Worked as a Information systems manager.   Safety:      -smoke alarm in the home:Yes     - wears seatbelt: Yes     - Feels  safe in their relationships: Yes         Social Determinants of Health   Financial Resource Strain: Low Risk  (01/16/2022)   Overall Financial Resource Strain (CARDIA)    Difficulty of Paying Living Expenses: Not hard at all  Food Insecurity: No Food Insecurity (01/16/2022)   Hunger Vital Sign    Worried About Running Out of Food in the Last Year: Never true    Ran Out of Food in the Last Year: Never true  Transportation Needs: No Transportation Needs (01/16/2022)   PRAPARE - Hydrologist  (Medical): No    Lack of Transportation (Non-Medical): No  Physical Activity: Sufficiently Active (01/16/2022)   Exercise Vital Sign    Days of Exercise per Week: 4 days    Minutes of Exercise per Session: 50 min  Stress: No Stress Concern Present (01/16/2022)   [place]    Feeling of Stress : Not at all  Social Connections: [place] (01/16/2022)   Social Connection and Isolation Panel [NHANES]    Frequency of Communication with Friends and Family: Three times a week    Frequency of Social Gatherings with Friends and Family: Twice a week    Attends Religious Services: More than 4 times per year    Active Member of [employer] or Organizations: Yes    Attends Music therapist: More than 4 times per year    Marital Status: Married  Human resources officer Violence: Not At Risk (10/12/2021)   Humiliation, Afraid, Rape, and Kick questionnaire    Fear of Current or Ex-Partner: No    Emotionally Abused: No    Physically Abused: No    Sexually Abused: No    Outpatient Encounter Medications as of 03/30/2022  Medication Sig   albuterol (VENTOLIN HFA) 108 (90 Base) MCG/ACT inhaler Inhale 2 puffs into the lungs every 6 (six) hours as needed for wheezing or shortness of breath.   Alpha-Lipoic Acid 200 MG CAPS Take by mouth.   amoxicillin-clavulanate (AUGMENTIN) 875-125 MG tablet Take 1 tablet by mouth 2 (two) times daily for 7 days.   azithromycin (ZITHROMAX Z-PAK) 250 MG tablet As directed   calcium carbonate (OS-CAL) 600 MG TABS tablet Take 600 mg by mouth 2 (two) times daily with a meal.   Cetirizine HCl (ZYRTEC PO) Take by mouth.   Cholecalciferol (VITAMIN D) 50 MCG (2000 UT) CAPS Take 1 capsule by mouth daily.   fluconazole (DIFLUCAN) 150 MG tablet Take 1 tablet (150 mg total) by mouth once for 1 dose.   MAGNESIUM PO Take 500 mg by mouth daily.    melatonin 5 MG TABS Take 5 mg by mouth at bedtime as needed.    Omega-3 Fatty Acids (FISH OIL) 1000 MG CAPS Take 1,000 mg by mouth daily.   pantoprazole (PROTONIX) 40 MG tablet Take 1 tablet (40 mg total) by mouth daily.   predniSONE (DELTASONE) 20 MG tablet 2 po at sametime daily for 5 days- start tomorrow   pregabalin (LYRICA) 50 MG capsule Take 1 capsule (50 mg total) by mouth at bedtime.   psyllium (METAMUCIL) 58.6 % packet Take 1 packet by mouth daily.   vitamin B-12 (CYANOCOBALAMIN) 250 MCG tablet Take 250 mcg by mouth daily.   [DISCONTINUED] bimatoprost (LATISSE) 0.03 % ophthalmic solution Place one drop on applicator and apply evenly along the skin of the upper eyelid at base of eyelashes once daily at bedtime; repeat procedure for second  eye (use a clean applicator).   No facility-administered encounter medications on file as of 03/30/2022.    Allergies  Allergen Reactions   Codeine Itching and Nausea And Vomiting   Hydrocodone     Other reaction(s): nausea    Review of Systems  Constitutional:  Positive for activity change, chills, fatigue and fever. Negative for appetite change, diaphoresis and unexpected weight change.  HENT:  Positive for congestion. Negative for ear pain, rhinorrhea, sinus pain, sneezing and sore throat.   Eyes: Negative.   Respiratory:  Positive for cough, chest tightness and wheezing. Negative for apnea, choking, shortness of breath and stridor.   Cardiovascular:  Negative for chest pain, palpitations and leg swelling.  Gastrointestinal:  Negative for abdominal pain, blood in stool, constipation, diarrhea, nausea and vomiting.  Endocrine: Negative.   Genitourinary:  Negative for decreased urine volume, difficulty urinating, dysuria, frequency and urgency.  Musculoskeletal:  Negative for arthralgias, joint pain, myalgias and neck pain.  Skin: Negative.  Negative for rash.  Allergic/Immunologic: Negative.   Neurological:  Positive for weakness. Negative for dizziness, tremors, seizures, syncope, facial asymmetry, speech  difficulty, light-headedness, numbness and headaches.  Hematological: Negative.   Psychiatric/Behavioral:  Negative for confusion, hallucinations, sleep disturbance and suicidal ideas.   All other systems reviewed and are negative.       Objective:  BP 116/80   Pulse 91   Temp 98.3 F (36.8 C)   Ht 5' 8.5" (1.74 m)   Wt 158 lb (71.7 kg)   SpO2 95%   BMI 23.67 kg/m    Wt Readings from Last 3 Encounters:  03/30/22 158 lb (71.7 kg)  01/17/22 160 lb (72.6 kg)  06/26/21 162 lb (73.5 kg)    Physical Exam Vitals and nursing note reviewed.  Constitutional:      General: She is not in acute distress.    Appearance: She is normal weight. She is ill-appearing. She is not toxic-appearing or diaphoretic.  HENT:     Head: Normocephalic and atraumatic.     Right Ear: Tympanic membrane, ear canal and external ear normal.     Left Ear: Tympanic membrane, ear canal and external ear normal.     Nose: Nose normal.     Mouth/Throat:     Mouth: Mucous membranes are moist.     Pharynx: Oropharynx is clear. No oropharyngeal exudate or posterior oropharyngeal erythema.  Eyes:     Conjunctiva/sclera: Conjunctivae normal.     Pupils: Pupils are equal, round, and reactive to light.  Cardiovascular:     Rate and Rhythm: Normal rate and regular rhythm.     Heart sounds: Normal heart sounds.  Pulmonary:     Effort: Pulmonary effort is normal.     Breath sounds: Examination of the right-lower field reveals rhonchi. Wheezing (scattered) and rhonchi present.  Musculoskeletal:     Cervical back: Neck supple.     Right lower leg: No edema.     Left lower leg: No edema.  Skin:    General: Skin is warm and dry.     Capillary Refill: Capillary refill takes less than 2 seconds.  Neurological:     General: No focal deficit present.     Mental Status: She is alert and oriented to person, place, and time.  Psychiatric:        Mood and Affect: Mood normal.        Behavior: Behavior normal.         Thought Content: Thought content normal.  Judgment: Judgment normal.     Results for orders placed or performed in visit on 06/26/21  Reticulocytes  Result Value Ref Range   Retic Ct Pct 1.5 0.4 - 3.1 %   RBC. 5.02 3.87 - 5.11 MIL/uL   Retic Count, Absolute 72.8 19.0 - 186.0 K/uL   Immature Retic Fract 12.3 2.3 - 15.9 %  Iron and TIBC  Result Value Ref Range   Iron 132 41 - 142 ug/dL   TIBC 325 236 - 444 ug/dL   Saturation Ratios 40 21 - 57 %   UIBC 194 120 - 384 ug/dL  Ferritin  Result Value Ref Range   Ferritin 109 11 - 307 ng/mL  CMP (Cancer Center only)  Result Value Ref Range   Sodium 139 135 - 145 mmol/L   Potassium 4.4 3.5 - 5.1 mmol/L   Chloride 103 98 - 111 mmol/L   CO2 29 22 - 32 mmol/L   Glucose, Bld 109 (H) 70 - 99 mg/dL   BUN 14 8 - 23 mg/dL   Creatinine 1.06 (H) 0.44 - 1.00 mg/dL   Calcium 10.2 8.9 - 10.3 mg/dL   Total Protein 7.3 6.5 - 8.1 g/dL   Albumin 4.5 3.5 - 5.0 g/dL   AST 21 15 - 41 U/L   ALT 22 0 - 44 U/L   Alkaline Phosphatase 93 38 - 126 U/L   Total Bilirubin 0.6 0.3 - 1.2 mg/dL   GFR, Estimated 57 (L) >60 mL/min   Anion gap 7 5 - 15  CBC with Differential (Cancer Center Only)  Result Value Ref Range   WBC Count 5.7 4.0 - 10.5 K/uL   RBC 4.97 3.87 - 5.11 MIL/uL   Hemoglobin 13.5 12.0 - 15.0 g/dL   HCT 42.0 36.0 - 46.0 %   MCV 84.5 80.0 - 100.0 fL   MCH 27.2 26.0 - 34.0 pg   MCHC 32.1 30.0 - 36.0 g/dL   RDW 23.5 (H) 11.5 - 15.5 %   Platelet Count 176 150 - 400 K/uL   nRBC 0.0 0.0 - 0.2 %   Neutrophils Relative % 45 %   Neutro Abs 2.6 1.7 - 7.7 K/uL   Lymphocytes Relative 44 %   Lymphs Abs 2.5 0.7 - 4.0 K/uL   Monocytes Relative 8 %   Monocytes Absolute 0.4 0.1 - 1.0 K/uL   Eosinophils Relative 1 %   Eosinophils Absolute 0.0 0.0 - 0.5 K/uL   Basophils Relative 1 %   Basophils Absolute 0.1 0.0 - 0.1 K/uL   Immature Granulocytes 1 %   Abs Immature Granulocytes 0.05 0.00 - 0.07 K/uL       Pertinent labs & imaging results that  were available during my care of the patient were reviewed by me and considered in my medical decision making.  Assessment & Plan:  Christy Kelly was seen today for new patient (initial visit).  Diagnoses and all orders for this visit:  Community acquired pneumonia of right lower lobe of lung Clinical assessment and reported symptoms consistent with CAP. Imaging is not available in office today. Will initiate below for CAP treatment. Pt is well compensated and does not need inpatient treatment. Aware of red flags and to report new, worsening, or persistent symptoms. Follow up I 2-3 weeks or sooner if warranted.  -     azithromycin (ZITHROMAX Z-PAK) 250 MG tablet; As directed -     amoxicillin-clavulanate (AUGMENTIN) 875-125 MG tablet; Take 1 tablet by mouth 2 (two) times daily for  7 days. -     predniSONE (DELTASONE) 20 MG tablet; 2 po at sametime daily for 5 days- start tomorrow -     albuterol (VENTOLIN HFA) 108 (90 Base) MCG/ACT inhaler; Inhale 2 puffs into the lungs every 6 (six) hours as needed for wheezing or shortness of breath. -     CBC with Differential/Platelet  History of vaginitis Pt to dose if warranted.  -     fluconazole (DIFLUCAN) 150 MG tablet; Take 1 tablet (150 mg total) by mouth once for 1 dose.     Continue all other maintenance medications.  Follow up plan: Return in about 3 weeks (around 04/20/2022), or if symptoms worsen or fail to improve, for establish care.   Continue healthy lifestyle choices, including diet (rich in fruits, vegetables, and lean proteins, and low in salt and simple carbohydrates) and exercise (at least 30 minutes of moderate physical activity daily).  Educational handout given for CAP  The above assessment and management plan was discussed with the patient. The patient verbalized understanding of and has agreed to the management plan. Patient is aware to call the clinic if they develop any new symptoms or if symptoms persist or worsen. Patient is  aware when to return to the clinic for a follow-up visit. Patient educated on when it is appropriate to go to the emergency department.   Monia Pouch, FNP-C [facility] [phone]

## 2022-05-03 ENCOUNTER — Ambulatory Visit: Payer: BLUE CROSS/BLUE SHIELD | Admitting: Family Medicine

## 2022-05-08 ENCOUNTER — Encounter: Payer: Self-pay | Admitting: Family Medicine

## 2022-05-08 ENCOUNTER — Ambulatory Visit (INDEPENDENT_AMBULATORY_CARE_PROVIDER_SITE_OTHER): Payer: Medicare Other | Admitting: Family Medicine

## 2022-05-08 VITALS — BP 118/81 | HR 79 | Temp 98.4°F | Ht 68.5 in | Wt 160.0 lb

## 2022-05-08 DIAGNOSIS — Z23 Encounter for immunization: Secondary | ICD-10-CM | POA: Diagnosis not present

## 2022-05-08 DIAGNOSIS — T63441A Toxic effect of venom of bees, accidental (unintentional), initial encounter: Secondary | ICD-10-CM | POA: Diagnosis not present

## 2022-05-08 MED ORDER — EPINEPHRINE 0.3 MG/0.3ML IJ SOAJ: 0.3000 mg | INTRAMUSCULAR | 11 refills | Status: AC | PRN

## 2022-05-08 NOTE — Progress Notes (Signed)
Subjective:  Patient ID: Christy Kelly, female    DOB: 1951/06/16, 71 y.o.   MRN: [id number]  Patient Care Team: Baruch Gouty, FNP as PCP - General (Family Medicine) Dian Queen, MD as Consulting Physician (Obstetrics and Gynecology) Satira Sark, MD as Consulting Physician (Cardiology) Penni Bombard, MD as Consulting Physician (Neurology) Ronnette Juniper, MD as Consulting Physician (Gastroenterology)   Chief Complaint:  rx for epipen   HPI: Christy Kelly is a 71 y.o. female presenting on 05/08/2022 for rx for epipen   Pt presents today to get a prescription for an epi pen. She has had reactions to bee stings in the past, states the reactions become more severe each time she gets stung. Denies prior anaphylactic reaction. She states she took benadryl with some relief of symptoms. She is concerned as she is getting ready to go on vacation and does not want to get stung and not have anything if symptoms worsen. Has not had epi pen in the past.      Relevant past medical, surgical, family, and social history reviewed and updated as indicated.  Allergies and medications reviewed and updated. Data reviewed: Chart in Epic.   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    Past Surgical History:  Procedure Laterality Date   BUNIONECTOMY     x2 about 2012 bilateral.    COLONOSCOPY  03/29/2021   Attending MD: Ronnette Juniper, MD; Location: [facility]  03/29/2021   Attending MD: Ronnette Juniper, MD; Location: [facility]      Social History   Socioeconomic History   Marital status:  Married    Spouse name: Richardson Landry   Number of children: 2   Years of education: 13   Highest education level: Some college, no degree  Occupational History    Comment: [employer]  Tobacco Use   Smoking status: Never   Smokeless tobacco: Never  Vaping Use   Vaping Use: Never used  Substance and Sexual Activity   Alcohol use: No   Drug use: No   Sexual activity: Yes    Partners: Male  Other Topics Concern   Not on file  Social History Narrative   Marital status/children/pets: married, lives with husband.    Education/employment: 1 year of college-retired.  Worked as a Information systems manager.   Safety:      -smoke alarm in the home:Yes     - wears seatbelt: Yes     - Feels safe in their relationships: Yes         Social Determinants of Health   Financial Resource Strain: Low Risk  (01/16/2022)   Overall Financial Resource Strain (CARDIA)    Difficulty of Paying Living Expenses: Not hard at all  Food Insecurity: No Food Insecurity (01/16/2022)   Hunger Vital Sign    Worried About Running Out of Food in the Last Year: Never true    Ran Out of Food in the Last Year: Never true  Transportation Needs: No Transportation Needs (01/16/2022)   PRAPARE - Transportation  Lack of Transportation (Medical): No    Lack of Transportation (Non-Medical): No  Physical Activity: Sufficiently Active (01/16/2022)   Exercise Vital Sign    Days of Exercise per Week: 4 days    Minutes of Exercise per Session: 50 min  Stress: No Stress Concern Present (01/16/2022)   [place]    Feeling of Stress : Not at all  Social Connections: [place] (01/16/2022)   Social Connection and Isolation Panel [NHANES]    Frequency of Communication with Friends and Family: Three times a week    Frequency of Social Gatherings with Friends and Family: Twice a week    Attends Religious Services: More than 4 times per year     Active Member of [employer] or Organizations: Yes    Attends Music therapist: More than 4 times per year    Marital Status: Married  Human resources officer Violence: Not At Risk (10/12/2021)   Humiliation, Afraid, Rape, and Kick questionnaire    Fear of Current or Ex-Partner: No    Emotionally Abused: No    Physically Abused: No    Sexually Abused: No    Outpatient Encounter Medications as of 05/08/2022  Medication Sig   EPINEPHrine 0.3 mg/0.3 mL IJ SOAJ injection Inject 0.3 mg into the muscle as needed for anaphylaxis.   albuterol (VENTOLIN HFA) 108 (90 Base) MCG/ACT inhaler Inhale 2 puffs into the lungs every 6 (six) hours as needed for wheezing or shortness of breath.   Alpha-Lipoic Acid 200 MG CAPS Take by mouth.   calcium carbonate (OS-CAL) 600 MG TABS tablet Take 600 mg by mouth 2 (two) times daily with a meal.   Cetirizine HCl (ZYRTEC PO) Take by mouth.   Cholecalciferol (VITAMIN D) 50 MCG (2000 UT) CAPS Take 1 capsule by mouth daily.   MAGNESIUM PO Take 500 mg by mouth daily.    melatonin 5 MG TABS Take 5 mg by mouth at bedtime as needed.   Omega-3 Fatty Acids (FISH OIL) 1000 MG CAPS Take 1,000 mg by mouth daily.   pantoprazole (PROTONIX) 40 MG tablet Take 1 tablet (40 mg total) by mouth daily.   pregabalin (LYRICA) 50 MG capsule Take 1 capsule (50 mg total) by mouth at bedtime.   psyllium (METAMUCIL) 58.6 % packet Take 1 packet by mouth daily.   vitamin B-12 (CYANOCOBALAMIN) 250 MCG tablet Take 250 mcg by mouth daily.   [DISCONTINUED] azithromycin (ZITHROMAX Z-PAK) 250 MG tablet As directed   [DISCONTINUED] predniSONE (DELTASONE) 20 MG tablet 2 po at sametime daily for 5 days- start tomorrow   No facility-administered encounter medications on file as of 05/08/2022.    Allergies  Allergen Reactions   Codeine Itching and Nausea And Vomiting   Hydrocodone     Other reaction(s): nausea    Review of Systems  Constitutional:  Negative for activity change, appetite change,  chills, diaphoresis, fatigue, fever and unexpected weight change.  HENT: Negative.  Negative for trouble swallowing and voice change.   Eyes: Negative.  Negative for photophobia, pain, discharge, redness, itching and visual disturbance.  Respiratory:  Negative for cough, chest tightness and shortness of breath.   Cardiovascular:  Negative for chest pain, palpitations and leg swelling.  Gastrointestinal:  Negative for abdominal pain, blood in stool, constipation, diarrhea, nausea and vomiting.  Endocrine: Negative.   Genitourinary:  Negative for decreased urine volume, difficulty urinating, dysuria, frequency and urgency.  Musculoskeletal:  Negative for arthralgias and myalgias.  Skin:  Positive for color change, rash and wound. Negative for pallor.  Allergic/Immunologic: Negative.   Neurological:  Negative for dizziness, tremors, seizures, syncope, facial asymmetry, speech difficulty, weakness, light-headedness, numbness and headaches.  Hematological: Negative.   Psychiatric/Behavioral:  Negative for confusion, hallucinations, sleep disturbance and suicidal ideas.   All other systems reviewed and are negative.       Objective:  BP 118/81   Pulse 79   Temp 98.4 F (36.9 C)   Ht 5' 8.5" (1.74 m)   Wt 160 lb (72.6 kg)   SpO2 95%   BMI 23.97 kg/m    Wt Readings from Last 3 Encounters:  05/08/22 160 lb (72.6 kg)  03/30/22 158 lb (71.7 kg)  01/17/22 160 lb (72.6 kg)    Physical Exam Vitals and nursing note reviewed.  Constitutional:      General: She is not in acute distress.    Appearance: Normal appearance. She is well-developed, well-groomed and normal weight. She is not ill-appearing, toxic-appearing or diaphoretic.  HENT:     Head: Normocephalic and atraumatic.     Jaw: There is normal jaw occlusion.     Right Ear: Hearing normal.     Left Ear: Hearing normal.     Nose: Nose normal.     Mouth/Throat:     Lips: Pink.     Mouth: Mucous membranes are moist.     Pharynx:  Uvula midline.  Eyes:     General: Lids are normal.     Pupils: Pupils are equal, round, and reactive to light.  Neck:     Thyroid: No thyroid mass, thyromegaly or thyroid tenderness.     Vascular: No JVD.     Trachea: Trachea and phonation normal.  Cardiovascular:     Rate and Rhythm: Normal rate and regular rhythm.     Chest Wall: PMI is not displaced.     Heart sounds: Normal heart sounds.  Pulmonary:     Effort: Pulmonary effort is normal.     Breath sounds: Normal breath sounds.  Abdominal:     General: There is no abdominal bruit.     Palpations: There is no hepatomegaly or splenomegaly.  Musculoskeletal:        General: Normal range of motion.     Cervical back: Neck supple.     Right lower leg: No edema.     Left lower leg: No edema.  Skin:    General: Skin is warm and dry.     Capillary Refill: Capillary refill takes less than 2 seconds.     Coloration: Skin is not cyanotic, jaundiced or pale.     Findings: No rash.  Neurological:     General: No focal deficit present.     Mental Status: She is alert and oriented to person, place, and time.     Sensory: Sensation is intact.     Motor: Motor function is intact.     Coordination: Coordination is intact.     Gait: Gait is intact.     Deep Tendon Reflexes: Reflexes are normal and symmetric.  Psychiatric:        Attention and Perception: Attention and perception normal.        Mood and Affect: Mood and affect normal.        Speech: Speech normal.        Behavior: Behavior normal. Behavior is cooperative.        Thought Content: Thought content normal.        Cognition  and Memory: Cognition and memory normal.        Judgment: Judgment normal.   Image of skin after bee sting shown to provider in office. Significant redness and swelling.   Results for orders placed or performed in visit on 03/30/22  CBC with Differential/Platelet  Result Value Ref Range   WBC 5.0 3.4 - 10.8 x10E3/uL   RBC 4.81 3.77 - 5.28 x10E6/uL    Hemoglobin 14.6 11.1 - 15.9 g/dL   Hematocrit 44.4 34.0 - 46.6 %   MCV 92 79 - 97 fL   MCH 30.4 26.6 - 33.0 pg   MCHC 32.9 31.5 - 35.7 g/dL   RDW 12.8 11.7 - 15.4 %   Platelets 175 150 - 450 x10E3/uL   Neutrophils 53 Not Estab. %   Lymphs 31 Not Estab. %   Monocytes 15 Not Estab. %   Eos 0 Not Estab. %   Basos 1 Not Estab. %   Neutrophils Absolute 2.6 1.4 - 7.0 x10E3/uL   Lymphocytes Absolute 1.6 0.7 - 3.1 x10E3/uL   Monocytes Absolute 0.8 0.1 - 0.9 x10E3/uL   EOS (ABSOLUTE) 0.0 0.0 - 0.4 x10E3/uL   Basophils Absolute 0.0 0.0 - 0.2 x10E3/uL   Immature Granulocytes 0 Not Estab. %   Immature Grans (Abs) 0.0 0.0 - 0.1 x10E3/uL       Pertinent labs & imaging results that were available during my care of the patient were reviewed by me and considered in my medical decision making.  Assessment & Plan:  Jazzie was seen today for rx for epipen.  Diagnoses and all orders for this visit:  Toxic effect of venom of bees, unintentional, initial encounter Pt had a significant reaction to a bee sting last week. Symptoms resolved with benadryl use but swelling and redness started immediately after sting and lasted for several hours. Will prescribe epi pen. Instructed on when to use and how to use. Aware she must be seen if epi pen is used.  -     EPINEPHrine 0.3 mg/0.3 mL IJ SOAJ injection; Inject 0.3 mg into the muscle as needed for anaphylaxis.     Continue all other maintenance medications.  Follow up plan: Return if symptoms worsen or fail to improve.   Continue healthy lifestyle choices, including diet (rich in fruits, vegetables, and lean proteins, and low in salt and simple carbohydrates) and exercise (at least 30 minutes of moderate physical activity daily).  Educational handout given for anaphylactic reaction   The above assessment and management plan was discussed with the patient. The patient verbalized understanding of and has agreed to the management plan. Patient is aware  to call the clinic if they develop any new symptoms or if symptoms persist or worsen. Patient is aware when to return to the clinic for a follow-up visit. Patient educated on when it is appropriate to go to the emergency department.   Monia Pouch, FNP-C [facility] [phone]

## 2022-05-08 NOTE — Patient Instructions (Signed)
Pepcid 40 mg  Benadryl 50 mg

## 2022-05-11 ENCOUNTER — Ambulatory Visit: Payer: BLUE CROSS/BLUE SHIELD | Admitting: Family Medicine

## 2022-05-25 ENCOUNTER — Encounter: Payer: Self-pay | Admitting: Hematology & Oncology

## 2022-05-25 ENCOUNTER — Ambulatory Visit (INDEPENDENT_AMBULATORY_CARE_PROVIDER_SITE_OTHER): Payer: Medicare Other | Admitting: Family Medicine

## 2022-05-25 ENCOUNTER — Encounter: Payer: Self-pay | Admitting: Family Medicine

## 2022-05-25 VITALS — BP 124/83 | HR 75 | Temp 97.4°F | Ht 68.5 in | Wt 158.8 lb

## 2022-05-25 DIAGNOSIS — K279 Peptic ulcer, site unspecified, unspecified as acute or chronic, without hemorrhage or perforation: Secondary | ICD-10-CM | POA: Diagnosis not present

## 2022-05-25 DIAGNOSIS — E538 Deficiency of other specified B group vitamins: Secondary | ICD-10-CM

## 2022-05-25 DIAGNOSIS — E559 Vitamin D deficiency, unspecified: Secondary | ICD-10-CM | POA: Diagnosis not present

## 2022-05-25 DIAGNOSIS — G629 Polyneuropathy, unspecified: Secondary | ICD-10-CM

## 2022-05-25 DIAGNOSIS — D500 Iron deficiency anemia secondary to blood loss (chronic): Secondary | ICD-10-CM

## 2022-05-25 DIAGNOSIS — M5416 Radiculopathy, lumbar region: Secondary | ICD-10-CM

## 2022-05-25 DIAGNOSIS — R635 Abnormal weight gain: Secondary | ICD-10-CM

## 2022-05-25 MED ORDER — PREDNISONE 20 MG PO TABS: ORAL_TABLET | ORAL | 0 refills | Status: DC

## 2022-05-25 MED ORDER — FAMOTIDINE 20 MG PO TABS: 20.0000 mg | ORAL_TABLET | Freq: Two times a day (BID) | ORAL | 1 refills | Status: DC

## 2022-05-25 NOTE — Progress Notes (Signed)
Subjective:  Patient ID: Christy Kelly, female    DOB: 12-04-1950, 71 y.o.   MRN: [id number]  Patient Care Team: Baruch Gouty, FNP as PCP - General (Family Medicine) Dian Queen, MD as Consulting Physician (Obstetrics and Gynecology) Satira Sark, MD as Consulting Physician (Cardiology) Penni Bombard, MD as Consulting Physician (Neurology) Ronnette Juniper, MD as Consulting Physician (Gastroenterology)   Chief Complaint:  New Patient (Initial Visit) ([facility] ) and Establish Care   HPI: Christy Kelly is a 71 y.o. female presenting on 05/25/2022 for New Patient (Initial Visit) ([facility] ) and Establish Care   Pt presents today for initial visit. Pt has been seen for 2 sick visits but not a chronic follow up. She has a history of IDA, neuropathy, Vit D and B12 deficiencies, PUD, DDD, venous insufficiency, and restless leg syndrome. She reports over the last several weeks she has had left lower back pain that radiates down her left leg. No new injuries. No loss of bowel or bladder function. She also reports unexplained weight gain over the last several weeks, states she is heavier now than she has ever been. She has been on PPI therapy for several years. States she stopped taking it several weeks ago and has been doing fairly well. She does have excessive gas at times. No hemoptysis, melena, or hematochezia.    Relevant past medical, surgical, family, and social history reviewed and updated as indicated.  Allergies and medications reviewed and updated. Data reviewed: Chart in Epic.   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    Past Surgical History:  Procedure Laterality Date   BUNIONECTOMY      x2 about 2012 bilateral.    COLONOSCOPY  03/29/2021   Attending MD: Ronnette Juniper, MD; Location: [facility]  03/29/2021   Attending MD: Ronnette Juniper, MD; Location: [facility]      Social History   Socioeconomic History   Marital status: Married    Spouse name: Richardson Landry   Number of children: 2   Years of education: 13   Highest education level: Some college, no degree  Occupational History    Comment: [employer]  Tobacco Use   Smoking status: Never   Smokeless tobacco: Never  Vaping Use   Vaping Use: Never used  Substance and Sexual Activity   Alcohol use: No   Drug use: No   Sexual activity: Yes    Partners: Male  Other Topics Concern   Not on file  Social History Narrative   Marital status/children/pets: married, lives with husband.    Education/employment: 1 year of college-retired.  Worked as a Information systems manager.   Safety:      -smoke alarm in the home:Yes     - wears seatbelt: Yes     - Feels safe in their relationships: Yes         Social Determinants of Health   Financial Resource Strain: Low Risk  (01/16/2022)   Overall Financial Resource Strain (CARDIA)    Difficulty of Paying  Living Expenses: Not hard at all  Food Insecurity: No Food Insecurity (01/16/2022)   Hunger Vital Sign    Worried About Running Out of Food in the Last Year: Never true    Ran Out of Food in the Last Year: Never true  Transportation Needs: No Transportation Needs (01/16/2022)   PRAPARE - Hydrologist (Medical): No    Lack of Transportation (Non-Medical): No  Physical Activity: Sufficiently Active (01/16/2022)   Exercise Vital Sign    Days of Exercise per Week: 4 days    Minutes of Exercise per Session: 50 min  Stress: No Stress Concern Present (01/16/2022)   [place]    Feeling of Stress : Not at all  Social Connections: [place] (01/16/2022)   Social Connection and Isolation Panel [NHANES]    Frequency of Communication with Friends and Family: Three times a week    Frequency of Social Gatherings with Friends and Family: Twice a week    Attends Religious Services: More than 4 times per year    Active Member of [employer] or Organizations: Yes    Attends Music therapist: More than 4 times per year    Marital Status: Married  Human resources officer Violence: Not At Risk (10/12/2021)   Humiliation, Afraid, Rape, and Kick questionnaire    Fear of Current or Ex-Partner: No    Emotionally Abused: No    Physically Abused: No    Sexually Abused: No    Outpatient Encounter Medications as of 05/25/2022  Medication Sig   albuterol (VENTOLIN HFA) 108 (90 Base) MCG/ACT inhaler Inhale 2 puffs into the lungs every 6 (six) hours as needed for wheezing or shortness of breath.   Alpha-Lipoic Acid 200 MG CAPS Take by mouth.   calcium carbonate (OS-CAL) 600 MG TABS tablet Take 600 mg by mouth 2 (two) times daily with a meal.   Cetirizine HCl (ZYRTEC PO) Take by mouth.   Cholecalciferol (VITAMIN D) 50 MCG (2000 UT) CAPS Take 1 capsule by mouth daily.   EPINEPHrine 0.3 mg/0.3 mL IJ SOAJ injection Inject 0.3 mg into the muscle as needed for anaphylaxis.   famotidine (PEPCID) 20 MG tablet Take 1 tablet (20 mg total) by mouth 2 (two) times daily.   MAGNESIUM PO Take 500 mg by mouth daily.    melatonin 5 MG TABS Take 5 mg by mouth at bedtime as needed.   Omega-3 Fatty Acids (FISH OIL) 1000 MG CAPS Take 1,000 mg by mouth daily.   predniSONE (DELTASONE) 20 MG tablet 2 po at sametime daily for 5 days- start tomorrow   pregabalin (LYRICA) 50 MG capsule Take 1 capsule (50 mg total) by mouth at bedtime.   psyllium (METAMUCIL) 58.6 % packet Take 1 packet by mouth daily.   vitamin B-12 (CYANOCOBALAMIN) 250 MCG tablet Take 250 mcg by mouth daily.    [DISCONTINUED] pantoprazole (PROTONIX) 40 MG tablet Take 1 tablet (40 mg total) by mouth daily.   No facility-administered encounter medications on file as of 05/25/2022.    Allergies  Allergen Reactions   Codeine Itching and Nausea And Vomiting   Hydrocodone     Other reaction(s): nausea    Review of Systems  Constitutional:  Positive for unexpected weight change. Negative for activity change, appetite change, chills, diaphoresis, fatigue and fever.  Eyes:  Negative for photophobia and visual disturbance.  Respiratory:  Negative for cough and shortness of breath.   Cardiovascular:  Negative for chest pain, palpitations and leg swelling.  Gastrointestinal:  Negative for abdominal pain, anal bleeding, blood in stool, diarrhea, nausea and vomiting.       GERD  Endocrine: Negative for cold intolerance, heat intolerance, polydipsia, polyphagia and polyuria.  Genitourinary:  Negative for decreased urine volume and difficulty urinating.  Musculoskeletal:  Positive for back pain and myalgias. Negative for arthralgias, gait problem, joint swelling, neck pain and neck stiffness.  Neurological:  Positive for numbness. Negative for dizziness, tremors, seizures, syncope, facial asymmetry, speech difficulty, weakness, light-headedness and headaches.  Psychiatric/Behavioral:  Negative for confusion.         Objective:  BP 124/83   Pulse 75   Temp (!) 97.4 F (36.3 C) (Temporal)   Ht 5' 8.5" (1.74 m)   Wt 158 lb 12.8 oz (72 kg)   SpO2 97%   BMI 23.79 kg/m    Wt Readings from Last 3 Encounters:  05/25/22 158 lb 12.8 oz (72 kg)  05/08/22 160 lb (72.6 kg)  03/30/22 158 lb (71.7 kg)    Physical Exam Vitals and nursing note reviewed.  Constitutional:      General: She is not in acute distress.    Appearance: Normal appearance. She is well-developed, well-groomed and normal weight. She is not ill-appearing, toxic-appearing or diaphoretic.  HENT:     Head: Normocephalic and atraumatic.      Jaw: There is normal jaw occlusion.     Right Ear: Hearing normal.     Left Ear: Hearing normal.     Nose: Nose normal.     Mouth/Throat:     Lips: Pink.     Mouth: Mucous membranes are moist.     Pharynx: Oropharynx is clear. Uvula midline.  Eyes:     General: Lids are normal.     Extraocular Movements: Extraocular movements intact.     Conjunctiva/sclera: Conjunctivae normal.     Pupils: Pupils are equal, round, and reactive to light.  Neck:     Thyroid: No thyroid mass, thyromegaly or thyroid tenderness.     Vascular: No carotid bruit or JVD.     Trachea: Trachea and phonation normal.  Cardiovascular:     Rate and Rhythm: Normal rate and regular rhythm.     Chest Wall: PMI is not displaced.     Pulses: Normal pulses.     Heart sounds: Normal heart sounds. No murmur heard.    No friction rub. No gallop.  Pulmonary:     Effort: Pulmonary effort is normal. No respiratory distress.     Breath sounds: Normal breath sounds. No wheezing.  Abdominal:     General: Bowel sounds are normal. There is no distension or abdominal bruit.     Palpations: Abdomen is soft. There is no hepatomegaly or splenomegaly.     Tenderness: There is no abdominal tenderness. There is no right CVA tenderness or left CVA tenderness.     Hernia: No hernia is present.  Musculoskeletal:     Cervical back: Normal range of motion and neck supple.     Thoracic back: Normal.     Lumbar back: Tenderness present. No swelling, edema, deformity, signs of trauma, lacerations, spasms or bony tenderness. Decreased range of motion. Positive left straight leg raise test. Negative right straight leg raise test. No scoliosis.     Right hip: Normal.     Left hip: Normal.     Right lower leg: No edema.     Left lower leg: No edema.  Lymphadenopathy:     Cervical: No cervical adenopathy.  Skin:    General: Skin is warm and dry.     Capillary Refill: Capillary refill takes less than 2 seconds.     Coloration: Skin is  not cyanotic, jaundiced or pale.     Findings: No rash.  Neurological:     General: No focal deficit present.     Mental Status: She is alert and oriented to person, place, and time.     Sensory: Sensation is intact.     Motor: Motor function is intact.     Coordination: Coordination is intact.     Gait: Gait is intact.     Deep Tendon Reflexes: Reflexes are normal and symmetric.  Psychiatric:        Attention and Perception: Attention and perception normal.        Mood and Affect: Mood and affect normal.        Speech: Speech normal.        Behavior: Behavior normal. Behavior is cooperative.        Thought Content: Thought content normal.        Cognition and Memory: Cognition and memory normal.        Judgment: Judgment normal.     Results for orders placed or performed in visit on 03/30/22  CBC with Differential/Platelet  Result Value Ref Range   WBC 5.0 3.4 - 10.8 x10E3/uL   RBC 4.81 3.77 - 5.28 x10E6/uL   Hemoglobin 14.6 11.1 - 15.9 g/dL   Hematocrit 44.4 34.0 - 46.6 %   MCV 92 79 - 97 fL   MCH 30.4 26.6 - 33.0 pg   MCHC 32.9 31.5 - 35.7 g/dL   RDW 12.8 11.7 - 15.4 %   Platelets 175 150 - 450 x10E3/uL   Neutrophils 53 Not Estab. %   Lymphs 31 Not Estab. %   Monocytes 15 Not Estab. %   Eos 0 Not Estab. %   Basos 1 Not Estab. %   Neutrophils Absolute 2.6 1.4 - 7.0 x10E3/uL   Lymphocytes Absolute 1.6 0.7 - 3.1 x10E3/uL   Monocytes Absolute 0.8 0.1 - 0.9 x10E3/uL   EOS (ABSOLUTE) 0.0 0.0 - 0.4 x10E3/uL   Basophils Absolute 0.0 0.0 - 0.2 x10E3/uL   Immature Granulocytes 0 Not Estab. %   Immature Grans (Abs) 0.0 0.0 - 0.1 x10E3/uL       Pertinent labs & imaging results that were available during my care of the patient were reviewed by me and considered in my medical decision making.  Assessment & Plan:  Keira was seen today for new patient (initial visit) and establish care.  Diagnoses and all orders for this visit:  Lumbar radiculopathy No red flags concerning  for cauda equina syndrome. Will burst with steroids. Report new, worsening, or persistent symptoms.  -     predniSONE (DELTASONE) 20 MG tablet; 2 po at sametime daily for 5 days- start tomorrow  Weight gain Diet and exercise discussed in detail. Labs pending.  -     Anemia Profile B -     CMP14+EGFR -     Thyroid Panel With TSH -     VITAMIN D 25 Hydroxy (Vit-D Deficiency, Fractures)  PUD (peptic ulcer disease) No red flags present. Will trial Pepcid to see if beneficial.  -     famotidine (PEPCID) 20 MG tablet; Take 1 tablet (20 mg total) by mouth 2 (two) times daily. -     Anemia Profile B  Iron deficiency anemia due to chronic blood loss Labs pending.  -     Anemia Profile B  Neuropathy No red flags present. Labs pending. Report new, worsening, or persistent symptoms.  -     Anemia Profile B -     CMP14+EGFR -     VITAMIN D 25 Hydroxy (Vit-D Deficiency, Fractures)  Vitamin B 12 deficiency Vitamin D deficiency Labs pending. Will adjust repletion therapy if warranted.  -     Anemia Profile B -     VITAMIN D 25 Hydroxy (Vit-D Deficiency, Fractures)     Continue all other maintenance medications.  Follow up plan: Return in about 4 weeks (around 06/22/2022), or if symptoms worsen or fail to improve, for back pain.   Continue healthy lifestyle choices, including diet (rich in fruits, vegetables, and lean proteins, and low in salt and simple carbohydrates) and exercise (at least 30 minutes of moderate physical activity daily).  Educational handout given for health maintenance, sciatica   The above assessment and management plan was discussed with the patient. The patient verbalized understanding of and has agreed to the management plan. Patient is aware to call the clinic if they develop any new symptoms or if symptoms persist or worsen. Patient is aware when to return to the clinic for a follow-up visit. Patient educated on when it is appropriate to go to the emergency  department.   Monia Pouch, FNP-C [place] [phone] '

## 2022-05-26 LAB — ANEMIA PROFILE B
Basophils Absolute: 0.1 10*3/uL (ref 0.0–0.2)
Basos: 1 %
EOS (ABSOLUTE): 0.1 10*3/uL (ref 0.0–0.4)
Eos: 1 %
Ferritin: 48 ng/mL (ref 15–150)
Folate: 9.2 ng/mL (ref >3.0)
Hematocrit: 43.8 % (ref 34.0–46.6)
Hemoglobin: 15.1 g/dL (ref 11.1–15.9)
Immature Grans (Abs): 0 10*3/uL (ref 0.0–0.1)
Immature Granulocytes: 0 %
Iron Saturation: 36 % (ref 15–55)
Iron: 127 ug/dL (ref 27–139)
Lymphocytes Absolute: 2 10*3/uL (ref 0.7–3.1)
Lymphs: 37 %
MCH: 31.3 pg (ref 26.6–33.0)
MCHC: 34.5 g/dL (ref 31.5–35.7)
MCV: 91 fL (ref 79–97)
Monocytes Absolute: 0.5 10*3/uL (ref 0.1–0.9)
Monocytes: 10 %
Neutrophils Absolute: 2.8 10*3/uL (ref 1.4–7.0)
Neutrophils: 51 %
Platelets: 215 10*3/uL (ref 150–450)
RBC: 4.82 x10E6/uL (ref 3.77–5.28)
RDW: 12.5 % (ref 11.7–15.4)
Retic Ct Pct: 1.2 % (ref 0.6–2.6)
Total Iron Binding Capacity: 353 ug/dL (ref 250–450)
UIBC: 226 ug/dL (ref 118–369)
Vitamin B-12: 1629 pg/mL — ABNORMAL HIGH (ref 232–1245)
WBC: 5.4 10*3/uL (ref 3.4–10.8)

## 2022-05-26 LAB — CMP14+EGFR
ALT: 27 IU/L (ref 0–32)
AST: 24 IU/L (ref 0–40)
Albumin/Globulin Ratio: 1.6 (ref 1.2–2.2)
Albumin: 4.3 g/dL (ref 3.9–4.9)
Alkaline Phosphatase: 135 IU/L — ABNORMAL HIGH (ref 44–121)
BUN/Creatinine Ratio: 11 — ABNORMAL LOW (ref 12–28)
BUN: 11 mg/dL (ref 8–27)
Bilirubin Total: 0.6 mg/dL (ref 0.0–1.2)
CO2: 24 mmol/L (ref 20–29)
Calcium: 9.9 mg/dL (ref 8.7–10.3)
Chloride: 104 mmol/L (ref 96–106)
Creatinine, Ser: 1.04 mg/dL — ABNORMAL HIGH (ref 0.57–1.00)
Globulin, Total: 2.7 g/dL (ref 1.5–4.5)
Glucose: 81 mg/dL (ref 70–99)
Potassium: 4.5 mmol/L (ref 3.5–5.2)
Sodium: 142 mmol/L (ref 134–144)
Total Protein: 7 g/dL (ref 6.0–8.5)
eGFR: 58 mL/min/{1.73_m2} — ABNORMAL LOW (ref >59)

## 2022-05-26 LAB — THYROID PANEL WITH TSH
Free Thyroxine Index: 2.1 (ref 1.2–4.9)
T3 Uptake Ratio: 28 % (ref 24–39)
T4, Total: 7.6 ug/dL (ref 4.5–12.0)
TSH: 1.51 u[IU]/mL (ref 0.450–4.500)

## 2022-05-26 LAB — VITAMIN D 25 HYDROXY (VIT D DEFICIENCY, FRACTURES): Vit D, 25-Hydroxy: 78.3 ng/mL (ref 30.0–100.0)

## 2022-06-15 ENCOUNTER — Ambulatory Visit: Payer: Medicare Other | Admitting: Family Medicine

## 2022-06-20 ENCOUNTER — Ambulatory Visit (INDEPENDENT_AMBULATORY_CARE_PROVIDER_SITE_OTHER): Payer: Medicare Other | Admitting: Family Medicine

## 2022-06-20 ENCOUNTER — Encounter: Payer: Self-pay | Admitting: Family Medicine

## 2022-06-20 VITALS — BP 129/83 | HR 94 | Temp 97.9°F | Ht 68.5 in | Wt 158.0 lb

## 2022-06-20 DIAGNOSIS — M62830 Muscle spasm of back: Secondary | ICD-10-CM

## 2022-06-20 DIAGNOSIS — M5416 Radiculopathy, lumbar region: Secondary | ICD-10-CM | POA: Diagnosis not present

## 2022-06-20 MED ORDER — CYCLOBENZAPRINE HCL 10 MG PO TABS: 10.0000 mg | ORAL_TABLET | Freq: Three times a day (TID) | ORAL | 0 refills | Status: DC | PRN

## 2022-06-20 MED ORDER — METHYLPREDNISOLONE ACETATE 40 MG/ML IJ SUSP: 40.0000 mg | Freq: Once | INTRAMUSCULAR | Status: DC

## 2022-06-20 NOTE — Progress Notes (Signed)
Subjective:  Patient ID: Christy Kelly, female    DOB: [birth year], 71 y.o.   MRN: [id number]  Patient Care Team: Baruch Gouty, FNP as PCP - General (Family Medicine) Dian Queen, MD as Consulting Physician (Obstetrics and Gynecology) Satira Sark, MD as Consulting Physician (Cardiology) Penni Bombard, MD as Consulting Physician (Neurology) Ronnette Juniper, MD as Consulting Physician (Gastroenterology)   Chief Complaint:  Lumbar radiculopathy (4 week follow up - patient states it is no better. Worse at night and has trouble sleeping )   HPI: Christy Kelly is a 71 y.o. female presenting on 06/20/2022 for Lumbar radiculopathy (4 week follow up - patient states it is no better. Worse at night and has trouble sleeping )   Pt presents today for lower back pain with radiculopathy follow up. She was placed on steroids and did very well while on the medications. States once she completed the medications, the symptoms returned. She has not been doing any exercises. She states the pain is in her lower back and radiates to her legs. Worse at night and she is unable to sleep. Cramping to shooting and stabbing in nature. No other associated symptoms.       Relevant past medical, surgical, family, and social history reviewed and updated as indicated.  Allergies and medications reviewed and updated. Data reviewed: Chart in Epic.   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    Past Surgical History:  Procedure Laterality Date   BUNIONECTOMY     x2 about 2012 bilateral.    COLONOSCOPY  03/29/2021   Attending MD: Ronnette Juniper, MD; Location: [facility]  03/29/2021   Attending MD: Ronnette Juniper, MD; Location: [facility]      Social History   Socioeconomic History   Marital status: Married    Spouse name: Richardson Landry   Number of children: 2   Years of education: 13   Highest education level: Some college, no degree  Occupational History    Comment: [employer]  Tobacco Use   Smoking status: Never   Smokeless tobacco: Never  Vaping Use   Vaping Use: Never used  Substance and Sexual Activity   Alcohol use: No   Drug use: No   Sexual activity: Yes    Partners: Male  Other Topics Concern   Not on file  Social History Narrative   Marital status/children/pets: married, lives with husband.    Education/employment: 1 year of college-retired.  Worked as a Information systems manager.   Safety:      -smoke alarm in the home:Yes     - wears seatbelt: Yes     - Feels safe in their relationships: Yes         Social Determinants of Health   Financial Resource Strain: Low Risk  (01/16/2022)   Overall Financial Resource Strain (CARDIA)    Difficulty of Paying Living Expenses: Not hard at all  Food Insecurity: No Food Insecurity (01/16/2022)   Hunger Vital Sign    Worried About Running Out of Food  in the Last Year: Never true    [place] in the Last Year: Never true  Transportation Needs: No Transportation Needs (01/16/2022)   PRAPARE - Hydrologist (Medical): No    Lack of Transportation (Non-Medical): No  Physical Activity: Sufficiently Active (01/16/2022)   Exercise Vital Sign    Days of Exercise per Week: 4 days    Minutes of Exercise per Session: 50 min  Stress: No Stress Concern Present (01/16/2022)   [place]    Feeling of Stress : Not at all  Social Connections: [place] (01/16/2022)   Social Connection and Isolation Panel [NHANES]    Frequency of  Communication with Friends and Family: Three times a week    Frequency of Social Gatherings with Friends and Family: Twice a week    Attends Religious Services: More than 4 times per year    Active Member of [employer] or Organizations: Yes    Attends Music therapist: More than 4 times per year    Marital Status: Married  Human resources officer Violence: Not At Risk (10/12/2021)   Humiliation, Afraid, Rape, and Kick questionnaire    Fear of Current or Ex-Partner: No    Emotionally Abused: No    Physically Abused: No    Sexually Abused: No    Outpatient Encounter Medications as of 06/20/2022  Medication Sig   albuterol (VENTOLIN HFA) 108 (90 Base) MCG/ACT inhaler Inhale 2 puffs into the lungs every 6 (six) hours as needed for wheezing or shortness of breath.   Alpha-Lipoic Acid 200 MG CAPS Take by mouth.   calcium carbonate (OS-CAL) 600 MG TABS tablet Take 600 mg by mouth 2 (two) times daily with a meal.   Cetirizine HCl (ZYRTEC PO) Take by mouth.   Cholecalciferol (VITAMIN D) 50 MCG (2000 UT) CAPS Take 1 capsule by mouth daily.   cyclobenzaprine (FLEXERIL) 10 MG tablet Take 1 tablet (10 mg total) by mouth 3 (three) times daily as needed for muscle spasms.   EPINEPHrine 0.3 mg/0.3 mL IJ SOAJ injection Inject 0.3 mg into the muscle as needed for anaphylaxis.   famotidine (PEPCID) 20 MG tablet Take 1 tablet (20 mg total) by mouth 2 (two) times daily.   MAGNESIUM PO Take 500 mg by mouth daily.    melatonin 5 MG TABS Take 5 mg by mouth at bedtime as needed.   Omega-3 Fatty Acids (FISH OIL) 1000 MG CAPS Take 1,000 mg by mouth daily.   pregabalin (LYRICA) 50 MG capsule Take 1 capsule (50 mg total) by mouth at bedtime.   psyllium (METAMUCIL) 58.6 % packet Take 1 packet by mouth daily.   vitamin B-12 (CYANOCOBALAMIN) 250 MCG tablet Take 250 mcg by mouth daily.   [DISCONTINUED] predniSONE (DELTASONE) 20 MG tablet 2 po at sametime daily for 5 days- start tomorrow   Facility-Administered  Encounter Medications as of 06/20/2022  Medication   methylPREDNISolone acetate (DEPO-MEDROL) injection 40 mg    Allergies  Allergen Reactions   Codeine Itching and Nausea And Vomiting   Hydrocodone     Other reaction(s): nausea    Review of Systems  Constitutional:  Positive for activity change. Negative for appetite change, chills, diaphoresis, fatigue, fever and unexpected weight change.  HENT: Negative.    Eyes: Negative.   Respiratory:  Negative for cough, chest tightness and shortness of breath.   Cardiovascular:  Negative for chest pain, palpitations and leg swelling.  Gastrointestinal:  Negative for abdominal pain, blood in stool, constipation, diarrhea, nausea and vomiting.  Endocrine: Negative.   Genitourinary:  Negative for decreased urine volume, difficulty urinating, dysuria, frequency and urgency.  Musculoskeletal:  Positive for arthralgias, back pain and myalgias. Negative for gait problem, joint swelling, neck pain and neck stiffness.  Skin: Negative.   Allergic/Immunologic: Negative.   Neurological:  Negative for dizziness, tremors, seizures, syncope, facial asymmetry, speech difficulty, weakness, light-headedness, numbness and headaches.  Hematological: Negative.   Psychiatric/Behavioral:  Negative for confusion, hallucinations, sleep disturbance and suicidal ideas.   All other systems reviewed and are negative.       Objective:  BP 129/83   Pulse 94   Temp 97.9 F (36.6 C) (Temporal)   Ht 5' 8.5" (1.74 m)   Wt 158 lb (71.7 kg)   SpO2 96%   BMI 23.67 kg/m    Wt Readings from Last 3 Encounters:  06/20/22 158 lb (71.7 kg)  05/25/22 158 lb 12.8 oz (72 kg)  05/08/22 160 lb (72.6 kg)    Physical Exam Vitals and nursing note reviewed.  Constitutional:      General: She is not in acute distress.    Appearance: Normal appearance. She is well-developed, well-groomed and normal weight. She is not ill-appearing, toxic-appearing or diaphoretic.  HENT:      Head: Normocephalic and atraumatic.     Jaw: There is normal jaw occlusion.     Right Ear: Hearing normal.     Left Ear: Hearing normal.     Nose: Nose normal.     Mouth/Throat:     Lips: Pink.     Mouth: Mucous membranes are moist.     Pharynx: Oropharynx is clear. Uvula midline.  Eyes:     General: Lids are normal.     Extraocular Movements: Extraocular movements intact.     Conjunctiva/sclera: Conjunctivae normal.     Pupils: Pupils are equal, round, and reactive to light.  Neck:     Thyroid: No thyroid mass, thyromegaly or thyroid tenderness.     Vascular: No carotid bruit or JVD.     Trachea: Trachea and phonation normal.  Cardiovascular:     Rate and Rhythm: Normal rate and regular rhythm.     Chest Wall: PMI is not displaced.     Pulses: Normal pulses.     Heart sounds: Normal heart sounds. No murmur heard.    No friction rub. No gallop.  Pulmonary:     Effort: Pulmonary effort is normal. No respiratory distress.     Breath sounds: Normal breath sounds. No wheezing.  Abdominal:     General: Bowel sounds are normal. There is no distension or abdominal bruit.     Palpations: Abdomen is soft. There is no hepatomegaly or splenomegaly.     Tenderness: There is no abdominal tenderness. There is no right CVA tenderness or left CVA tenderness.     Hernia: No hernia is present.  Musculoskeletal:     Cervical back: Normal, normal range of motion and neck supple.     Thoracic back: Normal.     Lumbar back: Spasms and tenderness present. No swelling, edema, deformity, signs of trauma, lacerations or bony tenderness. Normal range of motion. Positive right straight leg raise test and positive left straight leg raise test. No scoliosis.     Right hip: Normal.     Left hip: Normal.     Right lower leg: No edema.     Left lower leg: No edema.  Lymphadenopathy:  Cervical: No cervical adenopathy.  Skin:    General: Skin is warm and dry.     Capillary Refill: Capillary refill takes  less than 2 seconds.     Coloration: Skin is not cyanotic, jaundiced or pale.     Findings: No rash.  Neurological:     General: No focal deficit present.     Mental Status: She is alert and oriented to person, place, and time.     Sensory: Sensation is intact.     Motor: Motor function is intact.     Coordination: Coordination is intact.     Gait: Gait is intact.     Deep Tendon Reflexes: Reflexes are normal and symmetric.  Psychiatric:        Attention and Perception: Attention and perception normal.        Mood and Affect: Mood and affect normal.        Speech: Speech normal.        Behavior: Behavior normal. Behavior is cooperative.        Thought Content: Thought content normal.        Cognition and Memory: Cognition and memory normal.        Judgment: Judgment normal.     Results for orders placed or performed in visit on 05/25/22  Anemia Profile B  Result Value Ref Range   Total Iron Binding Capacity 353 250 - 450 ug/dL   UIBC 226 118 - 369 ug/dL   Iron 127 27 - 139 ug/dL   Iron Saturation 36 15 - 55 %   Ferritin 48 15 - 150 ng/mL   Vitamin B-12 1,629 (H) 232 - 1,245 pg/mL   Folate 9.2 >3.0 ng/mL   WBC 5.4 3.4 - 10.8 x10E3/uL   RBC 4.82 3.77 - 5.28 x10E6/uL   Hemoglobin 15.1 11.1 - 15.9 g/dL   Hematocrit 43.8 34.0 - 46.6 %   MCV 91 79 - 97 fL   MCH 31.3 26.6 - 33.0 pg   MCHC 34.5 31.5 - 35.7 g/dL   RDW 12.5 11.7 - 15.4 %   Platelets 215 150 - 450 x10E3/uL   Neutrophils 51 Not Estab. %   Lymphs 37 Not Estab. %   Monocytes 10 Not Estab. %   Eos 1 Not Estab. %   Basos 1 Not Estab. %   Neutrophils Absolute 2.8 1.4 - 7.0 x10E3/uL   Lymphocytes Absolute 2.0 0.7 - 3.1 x10E3/uL   Monocytes Absolute 0.5 0.1 - 0.9 x10E3/uL   EOS (ABSOLUTE) 0.1 0.0 - 0.4 x10E3/uL   Basophils Absolute 0.1 0.0 - 0.2 x10E3/uL   Immature Granulocytes 0 Not Estab. %   Immature Grans (Abs) 0.0 0.0 - 0.1 x10E3/uL   Retic Ct Pct 1.2 0.6 - 2.6 %  CMP14+EGFR  Result Value Ref Range   Glucose  81 70 - 99 mg/dL   BUN 11 8 - 27 mg/dL   Creatinine, Ser 1.04 (H) 0.57 - 1.00 mg/dL   eGFR 58 (L) >59 mL/min/1.73   BUN/Creatinine Ratio 11 (L) 12 - 28   Sodium 142 134 - 144 mmol/L   Potassium 4.5 3.5 - 5.2 mmol/L   Chloride 104 96 - 106 mmol/L   CO2 24 20 - 29 mmol/L   Calcium 9.9 8.7 - 10.3 mg/dL   Total Protein 7.0 6.0 - 8.5 g/dL   Albumin 4.3 3.9 - 4.9 g/dL   Globulin, Total 2.7 1.5 - 4.5 g/dL   Albumin/Globulin Ratio 1.6 1.2 - 2.2   Bilirubin Total  0.6 0.0 - 1.2 mg/dL   Alkaline Phosphatase 135 (H) 44 - 121 IU/L   AST 24 0 - 40 IU/L   ALT 27 0 - 32 IU/L  Thyroid Panel With TSH  Result Value Ref Range   TSH 1.510 0.450 - 4.500 uIU/mL   T4, Total 7.6 4.5 - 12.0 ug/dL   T3 Uptake Ratio 28 24 - 39 %   Free Thyroxine Index 2.1 1.2 - 4.9  VITAMIN D 25 Hydroxy (Vit-D Deficiency, Fractures)  Result Value Ref Range   Vit D, 25-Hydroxy 78.3 30.0 - 100.0 ng/mL       Pertinent labs & imaging results that were available during my care of the patient were reviewed by me and considered in my medical decision making.  Assessment & Plan:  [place] was seen today for lumbar radiculopathy.  Diagnoses and all orders for this visit:  Lumbar radiculopathy Lumbar paraspinal muscle spasm Ongoing but not worsening. Will burst with steroids again and provide as needed Flexeril. Declined referral to PT would like to try exercises at home, information printed out for her. Report new, worsening, or persistent symptoms.  -     methylPREDNISolone acetate (DEPO-MEDROL) injection 40 mg -     cyclobenzaprine (FLEXERIL) 10 MG tablet; Take 1 tablet (10 mg total) by mouth 3 (three) times daily as needed for muscle spasms.     Continue all other maintenance medications.  Follow up plan: Return if symptoms worsen or fail to improve.   Continue healthy lifestyle choices, including diet (rich in fruits, vegetables, and lean proteins, and low in salt and simple carbohydrates) and exercise (at least 30  minutes of moderate physical activity daily).  Educational handout given for back exercises   The above assessment and management plan was discussed with the patient. The patient verbalized understanding of and has agreed to the management plan. Patient is aware to call the clinic if they develop any new symptoms or if symptoms persist or worsen. Patient is aware when to return to the clinic for a follow-up visit. Patient educated on when it is appropriate to go to the emergency department.   Monia Pouch, FNP-C [facility] [phone]

## 2022-06-21 ENCOUNTER — Ambulatory Visit: Payer: BLUE CROSS/BLUE SHIELD | Admitting: Family Medicine

## 2022-06-22 ENCOUNTER — Ambulatory Visit: Payer: Medicare Other | Admitting: Family Medicine

## 2022-07-04 ENCOUNTER — Ambulatory Visit: Payer: Medicare Other | Admitting: Family Medicine

## 2022-08-19 DIAGNOSIS — M791 Myalgia, unspecified site: Secondary | ICD-10-CM | POA: Diagnosis not present

## 2022-08-19 DIAGNOSIS — U071 COVID-19: Secondary | ICD-10-CM | POA: Diagnosis not present

## 2022-09-28 ENCOUNTER — Telehealth: Payer: Self-pay | Admitting: Family Medicine

## 2022-09-28 NOTE — Telephone Encounter (Signed)
Contacted Christy Kelly to schedule their annual wellness visit. Appointment made for 10/15/2022.  Thank you,  Colletta Maryland,  [facility] Direct Dial ??[phone]

## 2022-10-15 ENCOUNTER — Ambulatory Visit (INDEPENDENT_AMBULATORY_CARE_PROVIDER_SITE_OTHER): Payer: Medicare Other

## 2022-10-15 VITALS — Ht 68.5 in | Wt 158.0 lb

## 2022-10-15 DIAGNOSIS — Z0000 Encounter for general adult medical examination without abnormal findings: Secondary | ICD-10-CM

## 2022-10-15 DIAGNOSIS — Z1231 Encounter for screening mammogram for malignant neoplasm of breast: Secondary | ICD-10-CM

## 2022-10-15 NOTE — Progress Notes (Signed)
Subjective:   Christy Kelly is a 72 y.o. female who presents for Medicare Annual (Subsequent) preventive examination.  I connected with  Christy Kelly on 10/15/22 by a audio enabled telemedicine application and verified that I am speaking with the correct person using two identifiers.  Patient Location: Home  Provider Location: Home Office  I discussed the limitations of evaluation and management by telemedicine. The patient expressed understanding and agreed to proceed.  Review of Systems     Cardiac Risk Factors include: advanced age (>65mn, >>82women)     Objective:    Today's Vitals   10/15/22 0848  Weight: 158 lb (71.7 kg)  Height: 5' 8.5" (1.74 m)   Body mass index is 23.67 kg/m.     10/15/2022    9:03 AM 10/12/2021    1:29 PM 10/11/2021    1:47 PM 06/26/2021   12:26 PM 05/19/2021    2:18 PM  Advanced Directives  Does Patient Have a Medical Advance Directive? Yes Yes Yes Yes Yes  Type of Advance Directive Living will;Healthcare Power of Attorney Living will;Healthcare Power of Attorney Living will Living will Living will  Does patient want to make changes to medical advance directive? No - Patient declined No - Patient declined No - Patient declined No - Patient declined No - Patient declined  Copy of [place] Chart? No - copy requested        Current Medications (verified) Outpatient Encounter Medications as of 10/15/2022  Medication Sig   Alpha-Lipoic Acid 200 MG CAPS Take by mouth.   calcium carbonate (OS-CAL) 600 MG TABS tablet Take 600 mg by mouth 2 (two) times daily with a meal.   Cetirizine HCl (ZYRTEC PO) Take by mouth.   MAGNESIUM PO Take 500 mg by mouth daily.    vitamin B-12 (CYANOCOBALAMIN) 250 MCG tablet Take 250 mcg by mouth daily.   albuterol (VENTOLIN HFA) 108 (90 Base) MCG/ACT inhaler Inhale 2 puffs into the lungs every 6 (six) hours as needed for wheezing or shortness of breath. (Patient not taking: Reported on 10/15/2022)    Cholecalciferol (VITAMIN D) 50 MCG (2000 UT) CAPS Take 1 capsule by mouth daily.   cyclobenzaprine (FLEXERIL) 10 MG tablet Take 1 tablet (10 mg total) by mouth 3 (three) times daily as needed for muscle spasms. (Patient not taking: Reported on 10/15/2022)   EPINEPHrine 0.3 mg/0.3 mL IJ SOAJ injection Inject 0.3 mg into the muscle as needed for anaphylaxis.   famotidine (PEPCID) 20 MG tablet Take 1 tablet (20 mg total) by mouth 2 (two) times daily. (Patient not taking: Reported on 10/15/2022)   melatonin 5 MG TABS Take 5 mg by mouth at bedtime as needed. (Patient not taking: Reported on 10/15/2022)   Omega-3 Fatty Acids (FISH OIL) 1000 MG CAPS Take 1,000 mg by mouth daily. (Patient not taking: Reported on 10/15/2022)   pregabalin (LYRICA) 50 MG capsule Take 1 capsule (50 mg total) by mouth at bedtime. (Patient not taking: Reported on 10/15/2022)   [DISCONTINUED] psyllium (METAMUCIL) 58.6 % packet Take 1 packet by mouth daily.   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   Past Surgical History:  Procedure Laterality Date   BUNIONECTOMY     x2 about 2012 bilateral.    COLONOSCOPY  03/29/2021   Attending MD: Ronnette Juniper, MD; Location: [facility]  03/29/2021   Attending MD: Ronnette Juniper, MD; Location: [facility]     Family History  Problem Relation Age of Onset   Dementia Mother    Stroke Father    Early death Father     Hypertension Brother    Hypercholesterolemia Brother    Alcohol abuse Brother    Breast cancer Paternal Grandmother    Cancer Paternal Grandfather        Uncertain type- Li fraumeni gene   Colon cancer Neg Hx    Esophageal cancer Neg Hx    Rectal cancer Neg Hx    Stomach cancer Neg Hx    Social History   Socioeconomic History   Marital status: Married    Spouse name: Richardson Landry   Number of children: 2   Years of education: 13   Highest education level: Some college, no degree  Occupational History    Comment: [employer]  Tobacco Use   Smoking status: Never   Smokeless tobacco: Never  Vaping Use   Vaping Use: Never used  Substance and Sexual Activity   Alcohol use: No   Drug use: No   Sexual activity: Yes    Partners: Male  Other Topics Concern   Not on file  Social History Narrative   Marital status/children/pets: married, lives with husband.    Education/employment: 1 year of college-retired.  Worked as a Information systems manager.   Safety:      -smoke alarm in the home:Yes     - wears seatbelt: Yes     - Feels safe in their relationships: Yes         Social Determinants of Health   Financial Resource Strain: Low Risk  (10/15/2022)   Overall Financial Resource Strain (CARDIA)    Difficulty of Paying Living Expenses: Not hard at all  Food Insecurity: No Food Insecurity (10/15/2022)   Hunger Vital Sign    Worried About Running Out of Food in the Last Year: Never true    Ran Out of Food in the Last Year: Never true  Transportation Needs: No Transportation Needs (01/16/2022)   PRAPARE - Hydrologist (Medical): No    Lack of Transportation (Non-Medical): No  Physical Activity: Sufficiently Active (10/15/2022)   Exercise Vital Sign    Days of Exercise per Week: 6 days    Minutes of Exercise per Session: 30 min  Stress: No Stress Concern Present (10/15/2022)   [place]    Feeling of Stress : Not at all  Social Connections: [place] (10/15/2022)   Social Connection and Isolation Panel [NHANES]    Frequency of Communication with Friends and Family: More than three times a week    Frequency of Social Gatherings with Friends and Family: Twice a week    Attends Religious Services: More than 4 times per year    Active Member of [employer] or Organizations: Yes    Attends Archivist Meetings: More than 4 times per year  Marital Status: Married    Tobacco Counseling Counseling given: Not Answered   Clinical Intake:  Pre-visit preparation completed: Yes  Pain : No/denies pain  Diabetes: No  How often do you need to have someone help you when you read instructions, pamphlets, or other written materials from your doctor or pharmacy?: 1 - Never  Diabetic?No   Interpreter Needed?: No  Information entered by :: Denman George LPN   Activities of Daily Living    10/15/2022    9:03 AM  In your present state of health, do you have any difficulty performing the following activities:  Hearing? 0  Vision? 0  Difficulty concentrating or making decisions? 0  Walking or climbing stairs? 0  Dressing or bathing? 0  Doing errands, shopping? 0  Preparing Food and eating ? N  Using the Toilet? N  In the past six months, have you accidently leaked urine? N  Do you have problems with loss of bowel control? N  Managing your Medications? N  Managing your Finances? N  Housekeeping or managing your Housekeeping? N    Patient Care Team: Baruch Gouty, FNP as PCP - General (Family Medicine) Dian Queen, MD as Consulting Physician (Obstetrics and Gynecology) Satira Sark, MD as Consulting Physician (Cardiology) Penni Bombard, MD as Consulting Physician (Neurology) Ronnette Juniper, MD as Consulting Physician (Gastroenterology) Celestia Khat, [place] (Optometry)  Indicate any recent Medical Services you may have  received from other than [facility] providers in the past year (date may be approximate).     Assessment:   This is a routine wellness examination for [employer].  Hearing/Vision screen Hearing Screening - Comments:: Denies hearing difficulties  Vision Screening - Comments:: up to date with routine eye exams with [facility] De[facility] Coder   Dietary issues and exercise activities discussed: Current Exercise Habits: Home exercise routine, Type of exercise: calisthenics;stretching, Time (Minutes): 30, Frequency (Times/Week): 6, Weekly Exercise (Minutes/Week): 180, Intensity: Mild   Goals Addressed             This Visit's Progress    Remain active and independent        Depression Screen    10/15/2022    9:05 AM 05/25/2022    2:11 PM 05/08/2022    9:35 AM 03/30/2022    9:22 AM 10/12/2021    1:26 PM 10/11/2021    1:49 PM 05/26/2021    9:55 AM  PHQ 2/9 Scores  PHQ - 2 Score 0 0 0 0 0 0 0  PHQ- 9 Score  2 0 1       Fall Risk    10/15/2022    8:55 AM 05/25/2022    2:11 PM 05/08/2022    9:35 AM 03/30/2022    9:23 AM 01/16/2022    9:43 PM  Fall Risk   Falls in the past year? 0 0 0 0 0  Number falls in past yr: 0      Injury with Fall? 0      Risk for fall due to : No Fall Risks      Follow up Falls prevention discussed;Education provided;Falls evaluation completed        FALL RISK PREVENTION PERTAINING TO THE HOME:  Any stairs in or around the home? Yes  If so, are there any without handrails? No  Home free of loose throw rugs in walkways, pet beds, electrical cords, etc? Yes  Adequate lighting in your home to reduce risk of falls? Yes   ASSISTIVE DEVICES UTILIZED TO PREVENT  FALLS:  Life alert? No  Use of a cane, walker or w/c? No  Grab bars in the bathroom? Yes  Shower chair or bench in shower? No  Elevated toilet seat or a handicapped toilet? Yes   TIMED UP AND GO:  Was the test performed? No . Telephonic visit   Cognitive Function:        10/15/2022    9:04 AM 10/12/2021    1:23  PM  6CIT Screen  What Year? 0 points 0 points  What month? 0 points 0 points  What time? 0 points 0 points  Count back from 20 0 points 0 points  Months in reverse 0 points 0 points  Repeat phrase 0 points 0 points  Total Score 0 points 0 points    Immunizations Immunization History  Administered Date(s) Administered   Fluad Quad(high Dose 65+) 04/07/2019, 05/26/2020, 05/26/2021, 05/08/2022   Moderna Sars-Covid-2 Vaccination 10/01/2019, 10/30/2019   Pneumococcal Conjugate-13 05/26/2020   Pneumococcal Polysaccharide-23 08/23/2018   Tdap 08/31/2006   Zoster Recombinat (Shingrix) 08/23/2018, 01/23/2019    TDAP status: Due, Education has been provided regarding the importance of this vaccine. Advised may receive this vaccine at local pharmacy or Health Dept. Aware to provide a copy of the vaccination record if obtained from local pharmacy or Health Dept. Verbalized acceptance and understanding.  Flu Vaccine status: Up to date  Pneumococcal vaccine status: Up to date  Covid-19 vaccine status: Information provided on how to obtain vaccines.   Qualifies for Shingles Vaccine? Yes   Zostavax completed No   Shingrix Completed?: Yes  Screening Tests Health Maintenance  Topic Date Due   DTaP/Tdap/Td (2 - Td or Tdap) 08/31/2016   COVID-19 Vaccine (3 - Moderna risk series) 11/27/2019   MAMMOGRAM  05/12/2023   Medicare Annual Wellness (AWV)  10/15/2023   COLONOSCOPY (Pts 45-68yr Insurance coverage will need to be confirmed)  03/29/2024   Pneumonia Vaccine 70 Years old  Completed   INFLUENZA VACCINE  Completed   DEXA SCAN  Completed   Hepatitis C Screening  Completed   Zoster Vaccines- Shingrix  Completed   HPV VACCINES  Aged Out    Health Maintenance  Health Maintenance Due  Topic Date Due   DTaP/Tdap/Td (2 - Td or Tdap) 08/31/2016   COVID-19 Vaccine (3 - Moderna risk series) 11/27/2019    Colorectal cancer screening: Type of screening: Colonoscopy. Completed 03/29/21.  Repeat every 3 years  Mammogram status: Ordered today. Pt provided with contact info and advised to call to schedule appt.   Bone Density status: Completed 08/15/18. Results reflect: Bone density results: OSTEOPENIA. Repeat every 2 years.  Lung Cancer Screening: (Low Dose CT Chest recommended if Age 253-80years, 30 pack-year currently smoking OR have quit w/in 15years.) does not qualify.   Lung Cancer Screening Referral: n/a  Additional Screening:  Hepatitis C Screening: does qualify; Completed 03/10/21  Vision Screening: Recommended annual ophthalmology exams for early detection of glaucoma and other disorders of the eye. Is the patient up to date with their annual eye exam?  Yes  Who is the provider or what is the name of the office in which the patient attends annual eye exams? [facility] MDebe Coder If pt is not established with a provider, would they like to be referred to a provider to establish care? No .   Dental Screening: Recommended annual dental exams for proper oral hygiene  Community Resource Referral / Chronic Care Management: CRR required this visit?  No   CCM  required this visit?  No      Plan:     I have personally reviewed and noted the following in the patient's chart:   Medical and social history Use of alcohol, tobacco or illicit drugs  Current medications and supplements including opioid prescriptions. Patient is not currently taking opioid prescriptions. Functional ability and status Nutritional status Physical activity Advanced directives List of other physicians Hospitalizations, surgeries, and ER visits in previous 12 months Vitals Screenings to include cognitive, depression, and falls Referrals and appointments  In addition, I have reviewed and discussed with patient certain preventive protocols, quality metrics, and best practice recommendations. A written personalized care plan for preventive services as well as general preventive health  recommendations were provided to patient.     Vanetta Mulders, [place]   [id number]   Due to this being a virtual visit, the after visit summary with patients personalized plan was offered to patient via mail or my-chart. Patient would like to access on my-chart  Nurse Notes: No concerns

## 2022-10-15 NOTE — Patient Instructions (Addendum)
[name] , Thank you for taking time to come for your Medicare Wellness Visit. I appreciate your ongoing commitment to your health goals. Please review the following plan we discussed and let me know if I can assist you in the future.   These are the goals we discussed:  Goals      Remain active and independent        This is a list of the screening recommended for you and due dates:  Health Maintenance  Topic Date Due   DTaP/Tdap/Td vaccine (2 - Td or Tdap) 08/31/2016   COVID-19 Vaccine (3 - Moderna risk series) 11/27/2019   Mammogram  05/12/2023   Medicare Annual Wellness Visit  10/15/2023   Colon Cancer Screening  03/29/2024   Pneumonia Vaccine  Completed   Flu Shot  Completed   DEXA scan (bone density measurement)  Completed   Hepatitis C Screening: USPSTF Recommendation to screen - Ages 70-79 yo.  Completed   Zoster (Shingles) Vaccine  Completed   HPV Vaccine  Aged Out    Advanced directives: Please bring a copy of your health care power of attorney and living will to the office to be added to your chart at your convenience.   Conditions/risks identified: .ahd  Next appointment: Follow up in one year for your annual wellness visit   The number to schedule your mammogram is [phone] for [facility] (if you would like to do it on the mobile unit that comes to the office just let them know when scheduling)    Preventive Care 70 Years and Older, Female Preventive care refers to lifestyle choices and visits with your health care provider that can promote health and wellness. What does preventive care include? A yearly physical exam. This is also called an annual well check. Dental exams once or twice a year. Routine eye exams. Ask your health care provider how often you should have your eyes checked. Personal lifestyle choices, including: Daily care of your teeth and gums. Regular physical activity. Eating a healthy diet. Avoiding tobacco and drug  use. Limiting alcohol use. Practicing safe sex. Taking low-dose aspirin every day. Taking vitamin and mineral supplements as recommended by your health care provider. What happens during an annual well check? The services and screenings done by your health care provider during your annual well check will depend on your age, overall health, lifestyle risk factors, and family history of disease. Counseling  Your health care provider may ask you questions about your: Alcohol use. Tobacco use. Drug use. Emotional well-being. Home and relationship well-being. Sexual activity. Eating habits. History of falls. Memory and ability to understand (cognition). Work and work Statistician. Reproductive health. Screening  You may have the following tests or measurements: Height, weight, and BMI. Blood pressure. Lipid and cholesterol levels. These may be checked every 5 years, or more frequently if you are over 60 years old. Skin check. Lung cancer screening. You may have this screening every year starting at age 70 if you have a 30-pack-year history of smoking and currently smoke or have quit within the past 15 years. Fecal occult blood test (FOBT) of the stool. You may have this test every year starting at age 70 Flexible sigmoidoscopy or colonoscopy. You may have a sigmoidoscopy every 5 years or a colonoscopy every 10 years starting at age 14. Hepatitis C blood test. Hepatitis B blood test. Sexually transmitted disease (STD) testing. Diabetes screening. This is done by checking your blood sugar (glucose) after you have  not eaten for a while (fasting). You may have this done every 70-3 years. Bone density scan. This is done to screen for osteoporosis. You may have this done starting at age 70. Mammogram. This may be done every 1-2 years. Talk to your health care provider about how often you should have regular mammograms. Talk with your health care provider about your test results, treatment  options, and if necessary, the need for more tests. Vaccines  Your health care provider may recommend certain vaccines, such as: Influenza vaccine. This is recommended every year. Tetanus, diphtheria, and acellular pertussis (Tdap, Td) vaccine. You may need a Td booster every 10 years. Zoster vaccine. You may need this after age 70. Pneumococcal 13-valent conjugate (PCV13) vaccine. One dose is recommended after age 70. Pneumococcal polysaccharide (PPSV23) vaccine. One dose is recommended after age 70. Talk to your health care provider about which screenings and vaccines you need and how often you need them. This information is not intended to replace advice given to you by your health care provider. Make sure you discuss any questions you have with your health care provider. Document Released: 08/19/2015 Document Revised: 04/11/2016 Document Reviewed: 05/24/2015 Elsevier Interactive Patient Education  2017 [place] Prevention in the Home Falls can cause injuries. They can happen to people of all ages. There are many things you can do to make your home safe and to help prevent falls. What can I do on the outside of my home? Regularly fix the edges of walkways and driveways and fix any cracks. Remove anything that might make you trip as you walk through a door, such as a raised step or threshold. Trim any bushes or trees on the path to your home. Use bright outdoor lighting. Clear any walking paths of anything that might make someone trip, such as rocks or tools. Regularly check to see if handrails are loose or broken. Make sure that both sides of any steps have handrails. Any raised decks and porches should have guardrails on the edges. Have any leaves, snow, or ice cleared regularly. Use sand or salt on walking paths during winter. Clean up any spills in your garage right away. This includes oil or grease spills. What can I do in the bathroom? Use night lights. Install grab  bars by the toilet and in the tub and shower. Do not use towel bars as grab bars. Use non-skid mats or decals in the tub or shower. If you need to sit down in the shower, use a plastic, non-slip stool. Keep the floor dry. Clean up any water that spills on the floor as soon as it happens. Remove soap buildup in the tub or shower regularly. Attach bath mats securely with double-sided non-slip rug tape. Do not have throw rugs and other things on the floor that can make you trip. What can I do in the bedroom? Use night lights. Make sure that you have a light by your bed that is easy to reach. Do not use any sheets or blankets that are too big for your bed. They should not hang down onto the floor. Have a firm chair that has side arms. You can use this for support while you get dressed. Do not have throw rugs and other things on the floor that can make you trip. What can I do in the kitchen? Clean up any spills right away. Avoid walking on wet floors. Keep items that you use a lot in easy-to-reach places. If you need to  reach something above you, use a strong step stool that has a grab bar. Keep electrical cords out of the way. Do not use floor polish or wax that makes floors slippery. If you must use wax, use non-skid floor wax. Do not have throw rugs and other things on the floor that can make you trip. What can I do with my stairs? Do not leave any items on the stairs. Make sure that there are handrails on both sides of the stairs and use them. Fix handrails that are broken or loose. Make sure that handrails are as long as the stairways. Check any carpeting to make sure that it is firmly attached to the stairs. Fix any carpet that is loose or worn. Avoid having throw rugs at the top or bottom of the stairs. If you do have throw rugs, attach them to the floor with carpet tape. Make sure that you have a light switch at the top of the stairs and the bottom of the stairs. If you do not have them,  ask someone to add them for you. What else can I do to help prevent falls? Wear shoes that: Do not have high heels. Have rubber bottoms. Are comfortable and fit you well. Are closed at the toe. Do not wear sandals. If you use a stepladder: Make sure that it is fully opened. Do not climb a closed stepladder. Make sure that both sides of the stepladder are locked into place. Ask someone to hold it for you, if possible. Clearly mark and make sure that you can see: Any grab bars or handrails. First and last steps. Where the edge of each step is. Use tools that help you move around (mobility aids) if they are needed. These include: Canes. Walkers. Scooters. Crutches. Turn on the lights when you go into a dark area. Replace any light bulbs as soon as they burn out. Set up your furniture so you have a clear path. Avoid moving your furniture around. If any of your floors are uneven, fix them. If there are any pets around you, be aware of where they are. Review your medicines with your doctor. Some medicines can make you feel dizzy. This can increase your chance of falling. Ask your doctor what other things that you can do to help prevent falls. This information is not intended to replace advice given to you by your health care provider. Make sure you discuss any questions you have with your health care provider. Document Released: 05/19/2009 Document Revised: 12/29/2015 Document Reviewed: 08/27/2014 Elsevier Interactive Patient Education  2017 [employer].

## 2022-11-21 IMAGING — MG MM DIGITAL SCREENING BILAT W/ TOMO AND CAD
8 series · 9 of 24 positions shown · non-contrast
Comparison: Previous exam(s).

CLINICAL DATA: Screening.

EXAM:
DIGITAL SCREENING BILATERAL MAMMOGRAM WITH TOMOSYNTHESIS AND CAD
TECHNIQUE: Bilateral screening digital craniocaudal and mediolateral oblique
mammograms were obtained. Bilateral screening digital breast
tomosynthesis was performed. The images were evaluated with
computer-aided detection.

[L CC synth-2D]
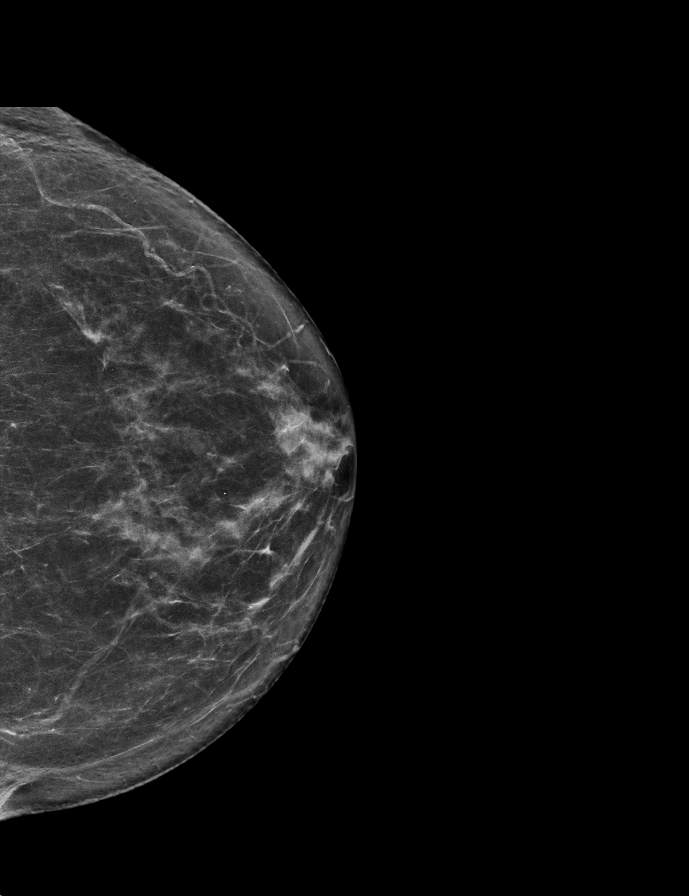

[R MLO synth-2D]
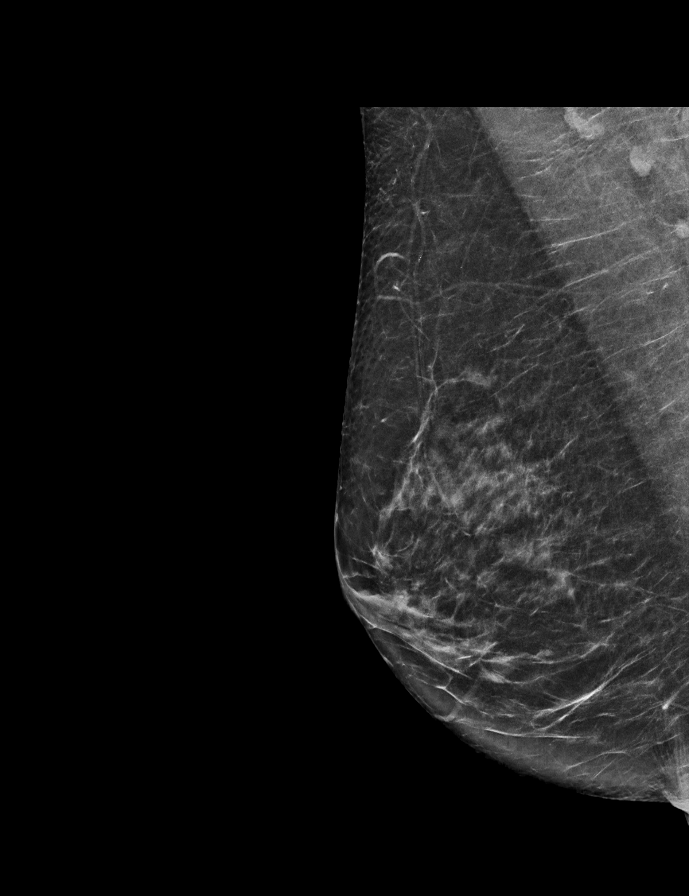

[R CC synth-2D]
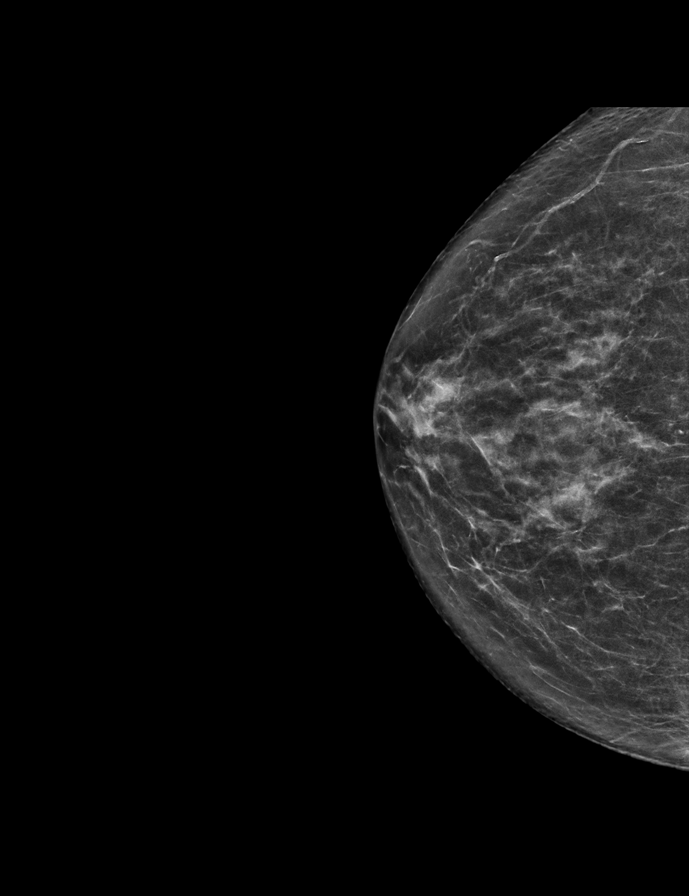

[L MLO synth-2D]
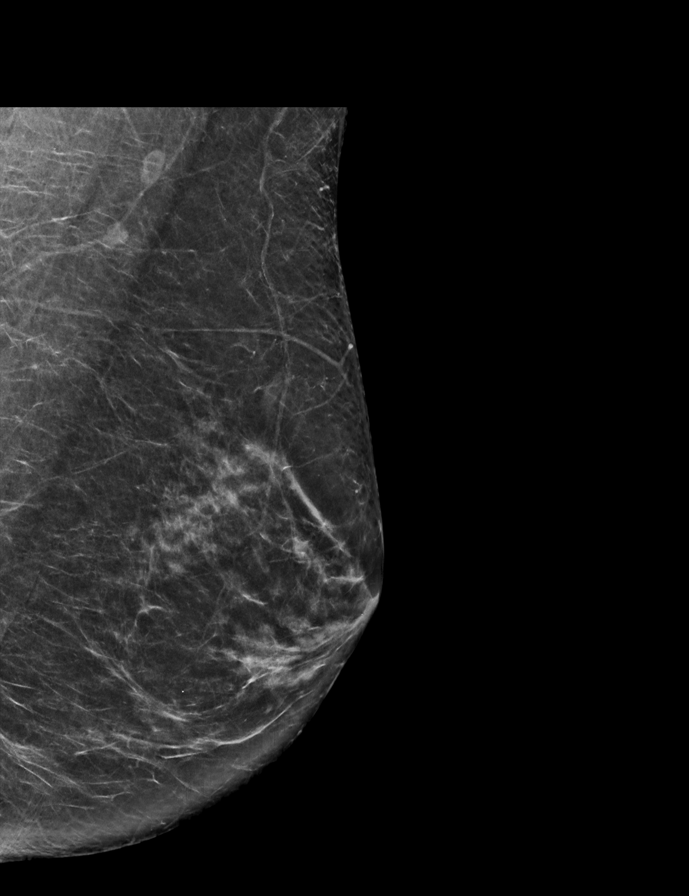

[R CC tomo · 2 of 64 frames shown]
[frame 21/64]
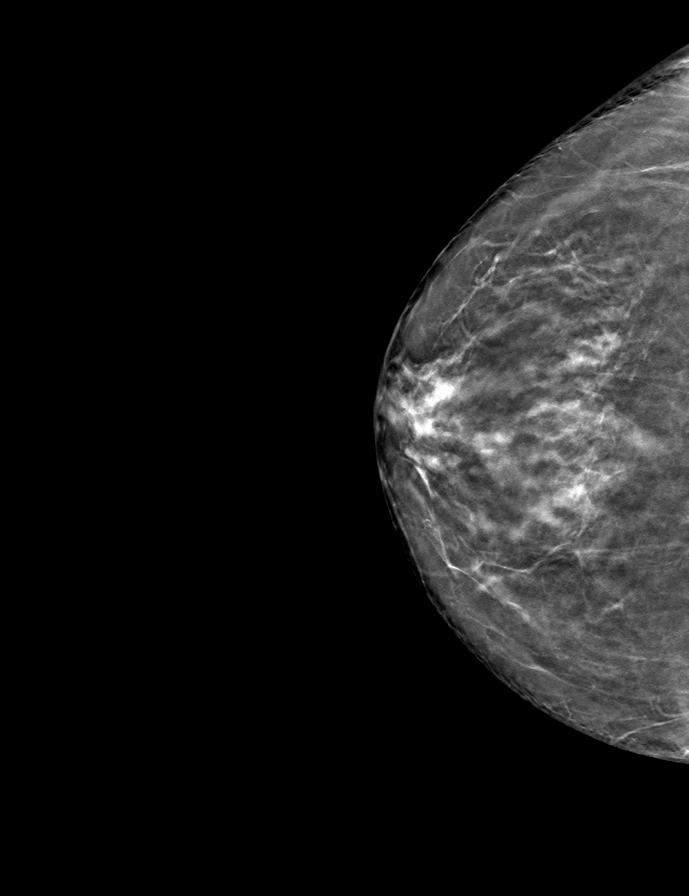
[frame 33/64]
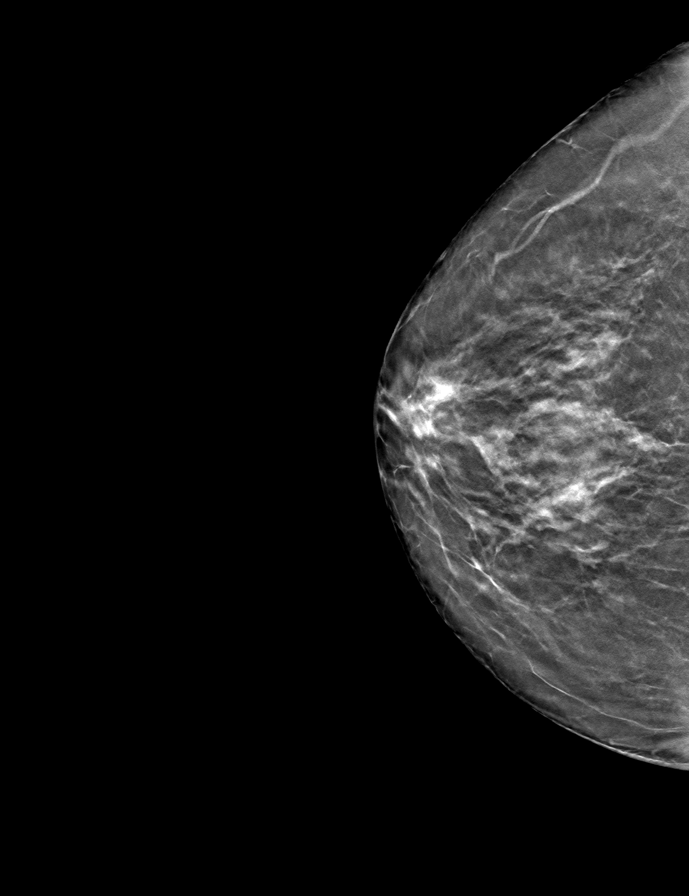

[R MLO tomo · tomo slice 33/65.0]
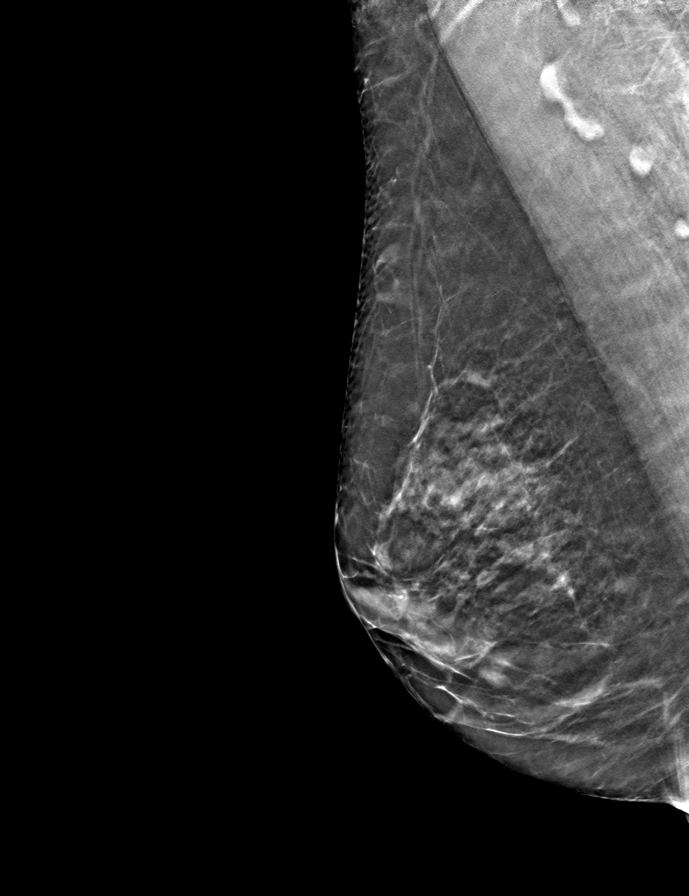

[L MLO tomo · tomo slice 35/68.0]
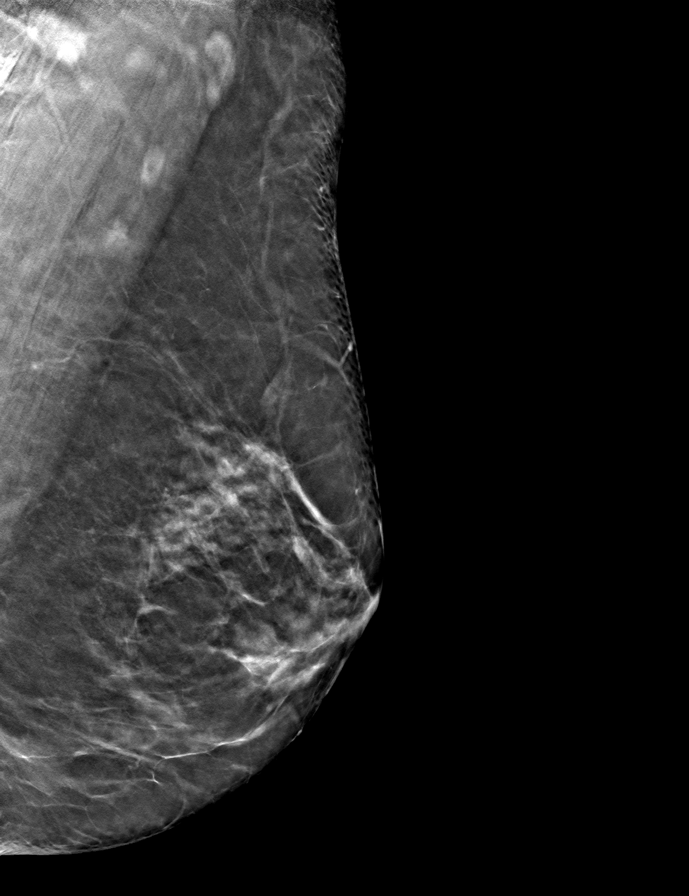

[L CC tomo · tomo slice 33/66.0]
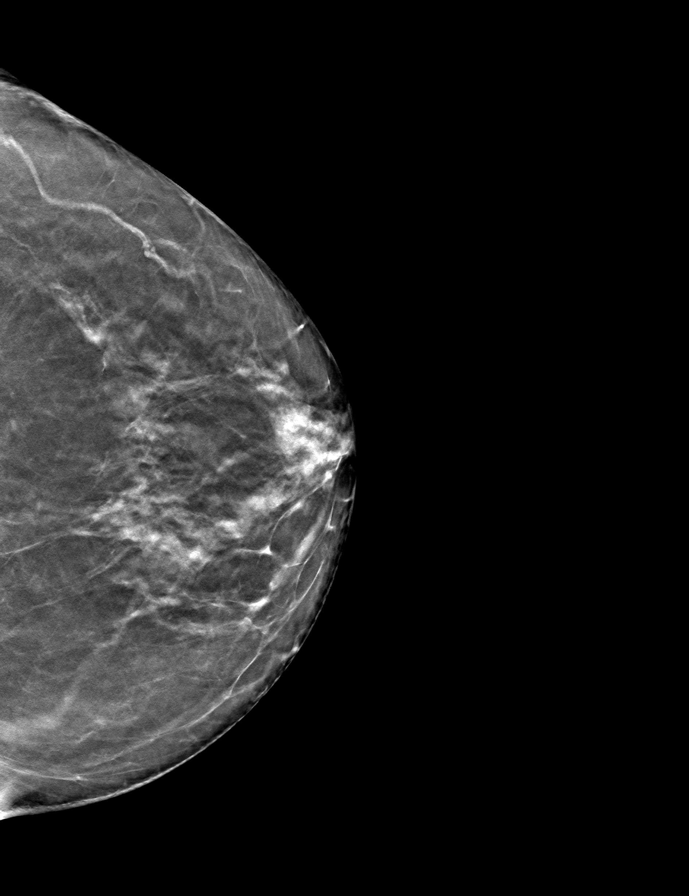

[9 of 24 positions shown; findings below may reference images not displayed]

ACR Breast Density Category b: There are scattered areas of
fibroglandular density.
FINDINGS: There are no findings suspicious for malignancy.
IMPRESSION: No mammographic evidence of malignancy. A result letter of this
screening mammogram will be mailed directly to the patient.

RECOMMENDATION:
Screening mammogram in one year. (Code:51-O-LD2)

BI-RADS CATEGORY  1: Negative.

## 2023-04-09 ENCOUNTER — Ambulatory Visit (INDEPENDENT_AMBULATORY_CARE_PROVIDER_SITE_OTHER): Payer: Medicare Other | Admitting: Family Medicine

## 2023-04-09 ENCOUNTER — Encounter: Payer: Self-pay | Admitting: Family Medicine

## 2023-04-09 VITALS — BP 120/70 | HR 74 | Temp 97.1°F | Ht 68.5 in | Wt 162.6 lb

## 2023-04-09 DIAGNOSIS — J208 Acute bronchitis due to other specified organisms: Secondary | ICD-10-CM | POA: Diagnosis not present

## 2023-04-09 DIAGNOSIS — B9689 Other specified bacterial agents as the cause of diseases classified elsewhere: Secondary | ICD-10-CM

## 2023-04-09 MED ORDER — LEVOFLOXACIN 500 MG PO TABS: 500.0000 mg | ORAL_TABLET | Freq: Every day | ORAL | 0 refills | Status: DC

## 2023-04-09 MED ORDER — BENZONATATE 200 MG PO CAPS: 200.0000 mg | ORAL_CAPSULE | Freq: Three times a day (TID) | ORAL | 0 refills | Status: DC | PRN

## 2023-04-09 NOTE — Progress Notes (Signed)
Chief Complaint  Patient presents with   Cough   Nasal Congestion   Sore Throat    HPI  Patient presents today for Patient presents with upper respiratory congestion. Rhinorrhea that is frequently purulent. There is moderate sore throat. Patient reports coughing frequently as well.  Yellow, green sputum noted. There is no fever, chills or sweats. The patient denies being short of breath. Chest feels tight.  Onset was 3-5 days ago. Gradually worsening. Tried OTCs without improvement.  PMH: Smoking status noted ROS: Per HPI  Objective: BP 120/70   Pulse 74   Temp (!) 97.1 F (36.2 C)   Ht 5' 8.5" (1.74 m)   Wt 162 lb 9.6 oz (73.8 kg)   SpO2 98%   BMI 24.36 kg/m  Gen: NAD, alert, cooperative with exam HEENT: NCAT, Nasal passages swollen, red TMS RED CV: RRR, good S1/S2, no murmur Resp: Bronchitis changes with scattered wheezes, non-labored Ext: No edema, warm Neuro: Alert and oriented, No gross deficits  Assessment and plan:  1. Acute bacterial bronchitis     Meds ordered this encounter  Medications   levofloxacin (LEVAQUIN) 500 MG tablet    Sig: Take 1 tablet (500 mg total) by mouth daily.    Dispense:  7 tablet    Refill:  0   benzonatate (TESSALON) 200 MG capsule    Sig: Take 1 capsule (200 mg total) by mouth 3 (three) times daily as needed for cough.    Dispense:  20 capsule    Refill:  0    No orders of the defined types were placed in this encounter.   Follow up as needed.  Mechele Claude, MD

## 2023-05-03 ENCOUNTER — Ambulatory Visit: Payer: Medicare Other | Admitting: Family Medicine

## 2023-05-15 ENCOUNTER — Ambulatory Visit
Admission: RE | Admit: 2023-05-15 | Discharge: 2023-05-15 | Disposition: A | Discharge status: Home / Self Care | Payer: Medicare Other | Source: Ambulatory Visit | Attending: Family Medicine

## 2023-05-15 DIAGNOSIS — Z1231 Encounter for screening mammogram for malignant neoplasm of breast: Secondary | ICD-10-CM | POA: Diagnosis not present

## 2023-05-28 DIAGNOSIS — L812 Freckles: Secondary | ICD-10-CM | POA: Diagnosis not present

## 2023-05-28 DIAGNOSIS — D1801 Hemangioma of skin and subcutaneous tissue: Secondary | ICD-10-CM | POA: Diagnosis not present

## 2023-05-28 DIAGNOSIS — L821 Other seborrheic keratosis: Secondary | ICD-10-CM | POA: Diagnosis not present

## 2023-05-28 DIAGNOSIS — L2089 Other atopic dermatitis: Secondary | ICD-10-CM | POA: Diagnosis not present

## 2023-05-28 DIAGNOSIS — L738 Other specified follicular disorders: Secondary | ICD-10-CM | POA: Diagnosis not present

## 2023-05-28 DIAGNOSIS — D225 Melanocytic nevi of trunk: Secondary | ICD-10-CM | POA: Diagnosis not present

## 2023-05-28 DIAGNOSIS — D2261 Melanocytic nevi of right upper limb, including shoulder: Secondary | ICD-10-CM | POA: Diagnosis not present

## 2023-05-28 DIAGNOSIS — L819 Disorder of pigmentation, unspecified: Secondary | ICD-10-CM | POA: Diagnosis not present

## 2023-07-18 DIAGNOSIS — R600 Localized edema: Secondary | ICD-10-CM | POA: Diagnosis not present

## 2023-07-18 DIAGNOSIS — I83893 Varicose veins of bilateral lower extremities with other complications: Secondary | ICD-10-CM | POA: Diagnosis not present

## 2023-07-18 DIAGNOSIS — I872 Venous insufficiency (chronic) (peripheral): Secondary | ICD-10-CM | POA: Diagnosis not present

## 2023-07-25 DIAGNOSIS — I83811 Varicose veins of right lower extremities with pain: Secondary | ICD-10-CM | POA: Diagnosis not present

## 2023-07-25 DIAGNOSIS — I83891 Varicose veins of right lower extremities with other complications: Secondary | ICD-10-CM | POA: Diagnosis not present

## 2023-09-04 ENCOUNTER — Telehealth: Payer: Medicare Other | Admitting: Physician Assistant

## 2023-09-04 DIAGNOSIS — J101 Influenza due to other identified influenza virus with other respiratory manifestations: Secondary | ICD-10-CM

## 2023-09-04 MED ORDER — OSELTAMIVIR PHOSPHATE 75 MG PO CAPS: 75.0000 mg | ORAL_CAPSULE | Freq: Two times a day (BID) | ORAL | 0 refills | Status: DC

## 2023-09-04 NOTE — Progress Notes (Signed)
E visit for Flu like symptoms   We are sorry that you are not feeling well.  Here is how we plan to help! Based on what you have shared with me it looks like you may have a respiratory virus that may be influenza.  Influenza or "the flu" is   an infection caused by a respiratory virus. The flu virus is highly contagious and persons who did not receive their yearly flu vaccination may "catch" the flu from close contact.  We have anti-viral medications to treat the viruses that cause this infection. They are not a "cure" and only shorten the course of the infection. These prescriptions are most effective when they are given within the first 2 days of "flu" symptoms. Antiviral medication are indicated if you have a high risk of complications from the flu. You should  also consider an antiviral medication if you are in close contact with someone who is at risk. These medications can help patients avoid complications from the flu  but have side effects that you should know. Possible side effects from Tamiflu or oseltamivir include nausea, vomiting, diarrhea, dizziness, headaches, eye redness, sleep problems or other respiratory symptoms. You should not take Tamiflu if you have an allergy to oseltamivir or any to the ingredients in Tamiflu.  Based upon your symptoms and potential risk factors I have prescribed Oseltamivir (Tamiflu).  It has been sent to your designated pharmacy.  You will take one 75 mg capsule orally twice a day for the next 5 days.  ANYONE WHO HAS FLU SYMPTOMS SHOULD: Stay home. The flu is highly contagious and going out or to work exposes others! Be sure to drink plenty of fluids. Water is fine as well as fruit juices, sodas and electrolyte beverages. You may want to stay away from caffeine or alcohol. If you are nauseated, try taking small sips of liquids. How do you know if you are getting enough fluid? Your urine should be a pale yellow or almost colorless. Get rest. Taking a steamy  shower or using a humidifier may help nasal congestion and ease sore throat pain. Using a saline nasal spray works much the same way. Cough drops, hard candies and sore throat lozenges may ease your cough. Line up a caregiver. Have someone check on you regularly.   GET HELP RIGHT AWAY IF: You cannot keep down liquids or your medications. You become short of breath Your fell like you are going to pass out or loose consciousness. Your symptoms persist after you have completed your treatment plan MAKE SURE YOU  Understand these instructions. Will watch your condition. Will get help right away if you are not doing well or get worse.  Your e-visit answers were reviewed by a board certified advanced clinical practitioner to complete your personal care plan.  Depending on the condition, your plan could have included both over the counter or prescription medications.  If there is a problem please reply  once you have received a response from your provider.  Your safety is important to [place].  If you have drug allergies check your prescription carefully.    You can use MyChart to ask questions about today's visit, request a non-urgent call back, or ask for a work or school excuse for 24 hours related to this e-Visit. If it has been greater than 24 hours you will need to follow up with your provider, or enter a new e-Visit to address those concerns.  You will get an e-mail in the next  two days asking about your experience.  I hope that your e-visit has been valuable and will speed your recovery. Thank you for using e-visits.  I have spent 5 minutes in review of e-visit questionnaire, review and updating patient chart, medical decision making and response to patient.   Margaretann Loveless, PA-C

## 2023-09-12 DIAGNOSIS — R600 Localized edema: Secondary | ICD-10-CM | POA: Diagnosis not present

## 2023-09-12 DIAGNOSIS — I872 Venous insufficiency (chronic) (peripheral): Secondary | ICD-10-CM | POA: Diagnosis not present

## 2023-09-12 DIAGNOSIS — I890 Lymphedema, not elsewhere classified: Secondary | ICD-10-CM | POA: Diagnosis not present

## 2023-09-12 DIAGNOSIS — I83891 Varicose veins of right lower extremities with other complications: Secondary | ICD-10-CM | POA: Diagnosis not present

## 2023-09-27 DIAGNOSIS — M7989 Other specified soft tissue disorders: Secondary | ICD-10-CM | POA: Diagnosis not present

## 2023-09-27 DIAGNOSIS — I83891 Varicose veins of right lower extremities with other complications: Secondary | ICD-10-CM | POA: Diagnosis not present

## 2023-09-27 DIAGNOSIS — I83811 Varicose veins of right lower extremities with pain: Secondary | ICD-10-CM | POA: Diagnosis not present

## 2023-10-07 ENCOUNTER — Telehealth: Payer: Self-pay | Admitting: Family Medicine

## 2023-10-07 NOTE — Telephone Encounter (Signed)
 Pt requests to switch providers. Wants to see husbands PCP, Dr Museum/gallery exhibitions officer. Husband is Tinie Mcgloin.

## 2023-10-07 NOTE — Telephone Encounter (Signed)
 Patient came in to the office, very upset. Says last Wednesday she tested positive for Flu A with an at home test and called our office to try and be seen either in person or by video so that she could get a Rx for Tamiflu. Says she was told by the call center that our office didn't have any openings for in person or for video visits until the following Wednesday. Says she was also advised to request an E-visit through her mychart. Patient very disappointed that she couldn't be seen at her own doctors office; not even by video visit with someone here.  Patient also says that she called earlier today to schedule her physical exam and was told that her PCP did not have any openings until next year. (Which is not true. I was able to offer her a June appt for a physical)   Patient says she has been in a lot of pain with her back and just wants someone to help her and get her a referral. (I was able to schedule pt for an OV tomorrow with PCP for this)  Patient is considering changing doctors offices outside of [facility] because she says this call center has caused her a lot of stress.

## 2023-10-08 ENCOUNTER — Encounter: Payer: Self-pay | Admitting: Family Medicine

## 2023-10-08 ENCOUNTER — Ambulatory Visit: Admitting: Family Medicine

## 2023-10-08 VITALS — BP 138/83 | HR 81 | Temp 97.3°F | Ht 68.5 in | Wt 160.8 lb

## 2023-10-08 DIAGNOSIS — M5441 Lumbago with sciatica, right side: Secondary | ICD-10-CM | POA: Diagnosis not present

## 2023-10-08 DIAGNOSIS — G8929 Other chronic pain: Secondary | ICD-10-CM

## 2023-10-08 MED ORDER — PREDNISONE 10 MG (21) PO TBPK
ORAL_TABLET | ORAL | 0 refills | Status: DC
Start: 2023-10-08 — End: 2023-11-08

## 2023-10-08 MED ORDER — KETOROLAC TROMETHAMINE 30 MG/ML IJ SOLN
30.0000 mg | Freq: Once | INTRAMUSCULAR | Status: AC
  Administered 2023-10-08: 30 mg via INTRAMUSCULAR

## 2023-10-08 MED ORDER — METHYLPREDNISOLONE ACETATE 40 MG/ML IJ SUSP
40.0000 mg | Freq: Once | INTRAMUSCULAR | Status: AC
  Administered 2023-10-08: 40 mg via INTRAMUSCULAR

## 2023-10-08 NOTE — Progress Notes (Signed)
 Subjective:  Patient ID: Christy Kelly, female    DOB: [birth year], 73 y.o.   MRN: [id number]  Patient Care Team: Sonny Masters, FNP as PCP - General (Family Medicine) Marcelle Overlie, MD as Consulting Physician (Obstetrics and Gynecology) Jonelle Sidle, MD as Consulting Physician (Cardiology) Suanne Marker, MD as Consulting Physician (Neurology) Kerin Salen, MD as Consulting Physician (Gastroenterology) Delora Fuel, OD (Optometry)   Chief Complaint:  Back Pain (States it has been going on for years and has gotten worse. )   HPI: Christy Kelly is a 73 y.o. female presenting on 10/08/2023 for Back Pain (States it has been going on for years and has gotten worse. )   Discussed the use of AI scribe software for clinical note transcription with the patient, who gave verbal consent to proceed.  History of Present Illness   Christy Kelly is a 73 year old female with degenerative changes in her spine who presents with worsening back pain radiating to the right leg.  She has been experiencing worsening back pain that radiates down her right leg, suggesting possible sciatic involvement. The pain is described as a sensation of a stick sticking in her back, with discomfort when sitting on her right cheek or standing on her right leg. Despite performing exercises for months, there has been no relief.  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 Years ago, she consulted a neurosurgeon in [place], where an MRI was performed, and she was told she had a 'beautiful spine.' However, her lumbar imaging shows degenerative changes.  She has been using ice and taking ibuprofen frequently to manage the pain, although she is concerned about its effects on her ulcer. She has also started seeing a chiropractor for her symptoms.          Relevant past medical, surgical, family, and social history reviewed and updated as indicated.   Allergies and medications reviewed and updated. Data reviewed: Chart in Epic.   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    Past Surgical History:  Procedure Laterality Date   BUNIONECTOMY     x2 about 2012 bilateral.    COLONOSCOPY  03/29/2021   Attending MD: Kerin Salen, MD; Location: [facility]   HYSTEROSCOPY  2001   TONSILLECTOMY  1962   UPPER GI ENDOSCOPY  03/29/2021   Attending MD: Kerin Salen, MD; Location: [facility]   WISDOM TOOTH EXTRACTION      Social History   Socioeconomic History   Marital status: Married    Spouse name: Brett Canales   Number of children: 2   Years of education: 13   Highest education level: 12th grade  Occupational History    Comment: [employer]  Tobacco Use   Smoking status: Never   Smokeless tobacco: Never  Vaping Use   Vaping status: Never Used  Substance and Sexual Activity   Alcohol use: No   Drug use: No   Sexual activity: Yes    Partners: Male  Other Topics Concern   Not on file  Social History Narrative   Marital status/children/pets: married, lives  with husband.    Education/employment: 1 year of college-retired.  Worked as a Actor.   Safety:      -smoke alarm in the home:Yes     - wears seatbelt: Yes     - Feels safe in their relationships: Yes         Social Drivers of Corporate investment banker Strain: Low Risk  (10/07/2023)   Overall Financial Resource Strain (CARDIA)    Difficulty of Paying Living Expenses: Not hard at all  Food Insecurity: No Food Insecurity (10/07/2023)   Hunger Vital Sign    Worried About Running Out of Food in the Last Year: Never true    Ran Out of Food in the Last Year:  Never true  Transportation Needs: No Transportation Needs (10/07/2023)   PRAPARE - Administrator, Civil Service (Medical): No    Lack of Transportation (Non-Medical): No  Physical Activity: Insufficiently Active (10/07/2023)   Exercise Vital Sign    Days of Exercise per Week: 2 days    Minutes of Exercise per Session: 30 min  Stress: No Stress Concern Present (10/07/2023)   [employer] of Occupational Health - Occupational Stress Questionnaire    Feeling of Stress : Not at all  Social Connections: Socially Integrated (10/07/2023)   Social Connection and Isolation Panel [NHANES]    Frequency of Communication with Friends and Family: More than three times a week    Frequency of Social Gatherings with Friends and Family: More than three times a week    Attends Religious Services: More than 4 times per year    Active Member of [employer] or Organizations: Yes    Attends Engineer, structural: More than 4 times per year    Marital Status: Married  Catering manager Violence: Not At Risk (10/15/2022)   Humiliation, Afraid, Rape, and Kick questionnaire    Fear of Current or Ex-Partner: No    Emotionally Abused: No    Physically Abused: No    Sexually Abused: No    Outpatient Encounter Medications as of 10/08/2023  Medication Sig   Alpha-Lipoic Acid 200 MG CAPS Take by mouth.   calcium carbonate (OS-CAL) 600 MG TABS tablet Take 600 mg by mouth 2 (two) times daily with a meal.   Cetirizine HCl (ZYRTEC PO) Take by mouth.   Cholecalciferol (VITAMIN D) 50 MCG (2000 UT) CAPS Take 1 capsule by mouth daily.   EPINEPHrine 0.3 mg/0.3 mL IJ SOAJ injection Inject 0.3 mg into the muscle as needed for anaphylaxis.   MAGNESIUM PO Take 500 mg by mouth daily.    melatonin 5 MG TABS Take 5 mg by mouth at bedtime as needed.   Omega-3 Fatty Acids (FISH OIL) 1000 MG CAPS Take 1,000 mg by mouth daily.   predniSONE (STERAPRED UNI-PAK 21 TAB) 10 MG (21) TBPK tablet Use as directed on back of pill  pack   vitamin B-12 (CYANOCOBALAMIN) 250 MCG tablet Take 250 mcg by mouth daily.   albuterol (VENTOLIN HFA) 108 (90 Base) MCG/ACT inhaler Inhale 2 puffs into the lungs every 6 (six) hours as needed for wheezing or shortness of breath. (Patient not taking: Reported on 10/08/2023)   [DISCONTINUED] benzonatate (TESSALON) 200 MG capsule Take 1 capsule (200 mg total) by mouth 3 (three) times daily as needed for cough.   [DISCONTINUED] levofloxacin (LEVAQUIN) 500 MG tablet Take 1 tablet (500 mg total) by mouth daily.   [DISCONTINUED] oseltamivir (TAMIFLU) 75 MG capsule Take 1 capsule (75  mg total) by mouth 2 (two) times daily.   Facility-Administered Encounter Medications as of 10/08/2023  Medication   [COMPLETED] ketorolac (TORADOL) 30 MG/ML injection 30 mg   methylPREDNISolone acetate (DEPO-MEDROL) injection 40 mg   [COMPLETED] methylPREDNISolone acetate (DEPO-MEDROL) injection 40 mg    Allergies  Allergen Reactions   Codeine Itching, Nausea And Vomiting, Hives and Nausea Only   Hydrocodone Nausea Only    Other reaction(s): nausea    Pertinent ROS per HPI, otherwise unremarkable      Objective:  BP 138/83   Pulse 81   Temp (!) 97.3 F (36.3 C)   Ht 5' 8.5" (1.74 m)   Wt 160 lb 12.8 oz (72.9 kg)   SpO2 98%   BMI 24.09 kg/m    Wt Readings from Last 3 Encounters:  10/08/23 160 lb 12.8 oz (72.9 kg)  04/09/23 162 lb 9.6 oz (73.8 kg)  10/15/22 158 lb (71.7 kg)    Physical Exam Vitals and nursing note reviewed.  Constitutional:      General: She is not in acute distress.    Appearance: Normal appearance. She is normal weight. She is not ill-appearing, toxic-appearing or diaphoretic.  HENT:     Head: Normocephalic and atraumatic.     Nose: Nose normal.     Mouth/Throat:     Mouth: Mucous membranes are moist.  Eyes:     Conjunctiva/sclera: Conjunctivae normal.     Pupils: Pupils are equal, round, and reactive to light.  Cardiovascular:     Rate and Rhythm: Normal rate and  regular rhythm.     Heart sounds: Normal heart sounds.  Pulmonary:     Effort: Pulmonary effort is normal.     Breath sounds: Normal breath sounds.  Musculoskeletal:     Cervical back: Normal range of motion and neck supple.     Thoracic back: Normal.     Lumbar back: Tenderness present. No swelling, edema, deformity, signs of trauma, lacerations, spasms or bony tenderness. Decreased range of motion. Positive right straight leg raise test. Negative left straight leg raise test. No scoliosis.     Right hip: Normal.     Left hip: Normal.     Right lower leg: No edema.     Left lower leg: No edema.       Legs:  Skin:    General: Skin is warm and dry.     Capillary Refill: Capillary refill takes less than 2 seconds.  Neurological:     General: No focal deficit present.     Mental Status: She is alert and oriented to person, place, and time.  Psychiatric:        Mood and Affect: Mood normal.        Behavior: Behavior normal.        Thought Content: Thought content normal.        Judgment: Judgment normal.    Results for orders placed or performed in visit on 05/25/22  Anemia Profile B   Collection Time: 05/25/22  2:21 PM  Result Value Ref Range   Total Iron Binding Capacity 353 250 - 450 ug/dL   UIBC 161 096 - 045 ug/dL   Iron 409 27 - 811 ug/dL   Iron Saturation 36 15 - 55 %   Ferritin 48 15 - 150 ng/mL   Vitamin B-12 1,629 (H) 232 - 1,245 pg/mL   Folate 9.2 >3.0 ng/mL   WBC 5.4 3.4 - 10.8 x10E3/uL   RBC 4.82 3.77 - 5.28 x10E6/uL  Hemoglobin 15.1 11.1 - 15.9 g/dL   Hematocrit 16.1 09.6 - 46.6 %   MCV 91 79 - 97 fL   MCH 31.3 26.6 - 33.0 pg   MCHC 34.5 31.5 - 35.7 g/dL   RDW 04.5 40.9 - 81.1 %   Platelets 215 150 - 450 x10E3/uL   Neutrophils 51 Not Estab. %   Lymphs 37 Not Estab. %   Monocytes 10 Not Estab. %   Eos 1 Not Estab. %   Basos 1 Not Estab. %   Neutrophils Absolute 2.8 1.4 - 7.0 x10E3/uL   Lymphocytes Absolute 2.0 0.7 - 3.1 x10E3/uL   Monocytes Absolute  0.5 0.1 - 0.9 x10E3/uL   EOS (ABSOLUTE) 0.1 0.0 - 0.4 x10E3/uL   Basophils Absolute 0.1 0.0 - 0.2 x10E3/uL   Immature Granulocytes 0 Not Estab. %   Immature Grans (Abs) 0.0 0.0 - 0.1 x10E3/uL   Retic Ct Pct 1.2 0.6 - 2.6 %  CMP14+EGFR   Collection Time: 05/25/22  2:21 PM  Result Value Ref Range   Glucose 81 70 - 99 mg/dL   BUN 11 8 - 27 mg/dL   Creatinine, Ser 9.14 (H) 0.57 - 1.00 mg/dL   eGFR 58 (L) >78 GN/FAO/1.30   BUN/Creatinine Ratio 11 (L) 12 - 28   Sodium 142 134 - 144 mmol/L   Potassium 4.5 3.5 - 5.2 mmol/L   Chloride 104 96 - 106 mmol/L   CO2 24 20 - 29 mmol/L   Calcium 9.9 8.7 - 10.3 mg/dL   Total Protein 7.0 6.0 - 8.5 g/dL   Albumin 4.3 3.9 - 4.9 g/dL   Globulin, Total 2.7 1.5 - 4.5 g/dL   Albumin/Globulin Ratio 1.6 1.2 - 2.2   Bilirubin Total 0.6 0.0 - 1.2 mg/dL   Alkaline Phosphatase 135 (H) 44 - 121 IU/L   AST 24 0 - 40 IU/L   ALT 27 0 - 32 IU/L  Thyroid Panel With TSH   Collection Time: 05/25/22  2:21 PM  Result Value Ref Range   TSH 1.510 0.450 - 4.500 uIU/mL   T4, Total 7.6 4.5 - 12.0 ug/dL   T3 Uptake Ratio 28 24 - 39 %   Free Thyroxine Index 2.1 1.2 - 4.9  VITAMIN D 25 Hydroxy (Vit-D Deficiency, Fractures)   Collection Time: 05/25/22  2:21 PM  Result Value Ref Range   Vit D, 25-Hydroxy 78.3 30.0 - 100.0 ng/mL       Pertinent labs & imaging results that were available during my care of the patient were reviewed by me and considered in my medical decision making.  Assessment & Plan:  Dhriti was seen today for back pain.  Diagnoses and all orders for this visit:  Chronic bilateral low back pain with right-sided sciatica -     Ambulatory referral to Neurosurgery -     predniSONE (STERAPRED UNI-PAK 21 TAB) 10 MG (21) TBPK tablet; Use as directed on back of pill pack -     methylPREDNISolone acetate (DEPO-MEDROL) injection 40 mg -     ketorolac (TORADOL) 30 MG/ML injection 30 mg     Assessment and Plan    Sciatica Worsening back pain with  radiation down the right leg, indicative of sciatica. Severe pain with a sensation of a stick in the right cheek, exacerbated by sitting or standing on the right leg. Degenerative spinal changes noted on previous imaging. Current management with ice and ibuprofen, but concerns about its effect on her ulcer. - Administer Toradol and Depamedrol  injection - Prescribe a steroid dose pack starting tomorrow - Refer to a neurosurgeon for further evaluation and repeat imaging       Continue all other maintenance medications.  Follow up plan: Return if symptoms worsen or fail to improve.   Continue healthy lifestyle choices, including diet (rich in fruits, vegetables, and lean proteins, and low in salt and simple carbohydrates) and exercise (at least 30 minutes of moderate physical activity daily).  Educational handout given for sciatica  The above assessment and management plan was discussed with the patient. The patient verbalized understanding of and has agreed to the management plan. Patient is aware to call the clinic if they develop any new symptoms or if symptoms persist or worsen. Patient is aware when to return to the clinic for a follow-up visit. Patient educated on when it is appropriate to go to the emergency department.   Kari Baars, FNP-C [facility] [phone]

## 2023-10-09 ENCOUNTER — Telehealth: Payer: Self-pay | Admitting: Family Medicine

## 2023-10-09 DIAGNOSIS — I83891 Varicose veins of right lower extremities with other complications: Secondary | ICD-10-CM | POA: Diagnosis not present

## 2023-10-09 DIAGNOSIS — I87391 Chronic venous hypertension (idiopathic) with other complications of right lower extremity: Secondary | ICD-10-CM | POA: Diagnosis not present

## 2023-10-09 NOTE — Telephone Encounter (Signed)
 Made note of this information in the Referral and will send as requested.

## 2023-10-09 NOTE — Telephone Encounter (Signed)
 Spoke with pt she would like to keep [facility] as her PCP

## 2023-10-09 NOTE — Telephone Encounter (Signed)
 Ok to switch

## 2023-10-11 NOTE — Telephone Encounter (Signed)
 I spoke with Christy Kelly and discussed the issues she had with trying to get an appointment with [place].  She did not understand that she was talking to a call center and not our office in general.  The call center was unable to see our DOD schedule which would have alleviated a lot of this issue.  She was very upset because of being sick as well.  I sympathized with her and the concerns she had and she was appreciative of this.  She said she finally got an E-visit through [facility] and was considering leaving our practice because of this. I assured her we are working towards getting this corrected and we definitely do not want to lose our patients over this.  She wants to stay with [facility] and she appreciated again the call to voice her concerns.

## 2023-10-18 ENCOUNTER — Encounter: Payer: Self-pay | Admitting: Family Medicine

## 2023-10-18 ENCOUNTER — Other Ambulatory Visit: Payer: Self-pay | Admitting: Family Medicine

## 2023-10-18 DIAGNOSIS — G2581 Restless legs syndrome: Secondary | ICD-10-CM

## 2023-10-18 DIAGNOSIS — D500 Iron deficiency anemia secondary to blood loss (chronic): Secondary | ICD-10-CM

## 2023-10-18 DIAGNOSIS — E559 Vitamin D deficiency, unspecified: Secondary | ICD-10-CM

## 2023-10-18 DIAGNOSIS — Z0000 Encounter for general adult medical examination without abnormal findings: Secondary | ICD-10-CM

## 2023-10-18 DIAGNOSIS — G629 Polyneuropathy, unspecified: Secondary | ICD-10-CM

## 2023-10-18 DIAGNOSIS — E538 Deficiency of other specified B group vitamins: Secondary | ICD-10-CM

## 2023-11-06 ENCOUNTER — Other Ambulatory Visit

## 2023-11-06 DIAGNOSIS — R6889 Other general symptoms and signs: Secondary | ICD-10-CM | POA: Diagnosis not present

## 2023-11-06 DIAGNOSIS — E559 Vitamin D deficiency, unspecified: Secondary | ICD-10-CM

## 2023-11-06 DIAGNOSIS — Z0000 Encounter for general adult medical examination without abnormal findings: Secondary | ICD-10-CM | POA: Diagnosis not present

## 2023-11-06 DIAGNOSIS — Z136 Encounter for screening for cardiovascular disorders: Secondary | ICD-10-CM | POA: Diagnosis not present

## 2023-11-06 DIAGNOSIS — E538 Deficiency of other specified B group vitamins: Secondary | ICD-10-CM

## 2023-11-06 DIAGNOSIS — G629 Polyneuropathy, unspecified: Secondary | ICD-10-CM | POA: Diagnosis not present

## 2023-11-06 DIAGNOSIS — G2581 Restless legs syndrome: Secondary | ICD-10-CM

## 2023-11-06 DIAGNOSIS — D500 Iron deficiency anemia secondary to blood loss (chronic): Secondary | ICD-10-CM

## 2023-11-06 DIAGNOSIS — E611 Iron deficiency: Secondary | ICD-10-CM

## 2023-11-07 LAB — THYROID PANEL WITH TSH
Free Thyroxine Index: 2.1 (ref 1.2–4.9)
T3 Uptake Ratio: 25 % (ref 24–39)
T4, Total: 8.2 ug/dL (ref 4.5–12.0)
TSH: 3.54 u[IU]/mL (ref 0.450–4.500)

## 2023-11-07 LAB — CMP14+EGFR
ALT: 21 IU/L (ref 0–32)
AST: 22 IU/L (ref 0–40)
Albumin: 4.1 g/dL (ref 3.9–4.9)
Alkaline Phosphatase: 99 IU/L (ref 44–121)
BUN/Creatinine Ratio: 15 (ref 12–28)
BUN: 14 mg/dL (ref 8–27)
Bilirubin Total: 0.3 mg/dL (ref 0.0–1.2)
CO2: 23 mmol/L (ref 20–29)
Calcium: 9.1 mg/dL (ref 8.7–10.3)
Chloride: 106 mmol/L (ref 96–106)
Creatinine, Ser: 0.95 mg/dL (ref 0.57–1.00)
Globulin, Total: 2.5 g/dL (ref 1.5–4.5)
Glucose: 85 mg/dL (ref 70–99)
Potassium: 4.3 mmol/L (ref 3.5–5.2)
Sodium: 142 mmol/L (ref 134–144)
Total Protein: 6.6 g/dL (ref 6.0–8.5)
eGFR: 64 mL/min/{1.73_m2} (ref >59)

## 2023-11-07 LAB — ANEMIA PROFILE B
Basophils Absolute: 0.1 10*3/uL (ref 0.0–0.2)
Basos: 1 %
EOS (ABSOLUTE): 0 10*3/uL (ref 0.0–0.4)
Eos: 1 %
Ferritin: 10 ng/mL — ABNORMAL LOW (ref 15–150)
Folate: 10.1 ng/mL (ref >3.0)
Hematocrit: 34.2 % (ref 34.0–46.6)
Hemoglobin: 10.8 g/dL — ABNORMAL LOW (ref 11.1–15.9)
Immature Grans (Abs): 0 10*3/uL (ref 0.0–0.1)
Immature Granulocytes: 0 %
Iron Saturation: 7 % — CL (ref 15–55)
Iron: 27 ug/dL (ref 27–139)
Lymphocytes Absolute: 1.9 10*3/uL (ref 0.7–3.1)
Lymphs: 51 %
MCH: 26.9 pg (ref 26.6–33.0)
MCHC: 31.6 g/dL (ref 31.5–35.7)
MCV: 85 fL (ref 79–97)
Monocytes Absolute: 0.4 10*3/uL (ref 0.1–0.9)
Monocytes: 11 %
Neutrophils Absolute: 1.3 10*3/uL — ABNORMAL LOW (ref 1.4–7.0)
Neutrophils: 36 %
Platelets: 208 10*3/uL (ref 150–450)
RBC: 4.01 x10E6/uL (ref 3.77–5.28)
RDW: 14.3 % (ref 11.7–15.4)
Retic Ct Pct: 1.4 % (ref 0.6–2.6)
Total Iron Binding Capacity: 406 ug/dL (ref 250–450)
UIBC: 379 ug/dL — ABNORMAL HIGH (ref 118–369)
Vitamin B-12: 612 pg/mL (ref 232–1245)
WBC: 3.7 10*3/uL (ref 3.4–10.8)

## 2023-11-07 LAB — LIPID PANEL
Chol/HDL Ratio: 3.7 ratio (ref 0.0–4.4)
Cholesterol, Total: 196 mg/dL (ref 100–199)
HDL: 53 mg/dL (ref >39)
LDL Chol Calc (NIH): 124 mg/dL — ABNORMAL HIGH (ref 0–99)
Triglycerides: 108 mg/dL (ref 0–149)
VLDL Cholesterol Cal: 19 mg/dL (ref 5–40)

## 2023-11-07 LAB — VITAMIN D 25 HYDROXY (VIT D DEFICIENCY, FRACTURES): Vit D, 25-Hydroxy: 63.3 ng/mL (ref 30.0–100.0)

## 2023-11-08 ENCOUNTER — Encounter: Payer: Self-pay | Admitting: Family Medicine

## 2023-11-08 ENCOUNTER — Ambulatory Visit (INDEPENDENT_AMBULATORY_CARE_PROVIDER_SITE_OTHER)

## 2023-11-08 ENCOUNTER — Ambulatory Visit: Admitting: Family Medicine

## 2023-11-08 ENCOUNTER — Other Ambulatory Visit

## 2023-11-08 VITALS — BP 116/68 | HR 93 | Temp 98.4°F | Ht 68.5 in | Wt 157.2 lb

## 2023-11-08 DIAGNOSIS — E538 Deficiency of other specified B group vitamins: Secondary | ICD-10-CM

## 2023-11-08 DIAGNOSIS — Z1212 Encounter for screening for malignant neoplasm of rectum: Secondary | ICD-10-CM | POA: Diagnosis not present

## 2023-11-08 DIAGNOSIS — Z13820 Encounter for screening for osteoporosis: Secondary | ICD-10-CM | POA: Diagnosis not present

## 2023-11-08 DIAGNOSIS — D500 Iron deficiency anemia secondary to blood loss (chronic): Secondary | ICD-10-CM | POA: Diagnosis not present

## 2023-11-08 DIAGNOSIS — Z0001 Encounter for general adult medical examination with abnormal findings: Secondary | ICD-10-CM | POA: Diagnosis not present

## 2023-11-08 DIAGNOSIS — Z780 Asymptomatic menopausal state: Secondary | ICD-10-CM | POA: Diagnosis not present

## 2023-11-08 DIAGNOSIS — Z1211 Encounter for screening for malignant neoplasm of colon: Secondary | ICD-10-CM

## 2023-11-08 DIAGNOSIS — Z0000 Encounter for general adult medical examination without abnormal findings: Secondary | ICD-10-CM

## 2023-11-08 DIAGNOSIS — M85852 Other specified disorders of bone density and structure, left thigh: Secondary | ICD-10-CM | POA: Diagnosis not present

## 2023-11-08 DIAGNOSIS — Z1231 Encounter for screening mammogram for malignant neoplasm of breast: Secondary | ICD-10-CM

## 2023-11-08 DIAGNOSIS — E559 Vitamin D deficiency, unspecified: Secondary | ICD-10-CM | POA: Diagnosis not present

## 2023-11-08 NOTE — Patient Instructions (Addendum)
 Dr. Myna Hidalgo  Address: [address] [place], [place] [address] Phone: [phone]   Iron is essential for producing hemoglobin, which helps carry oxygen in your blood. There are two types of iron in food: heme iron (from animal sources) and non-heme iron (from plant sources). Here are some iron-rich foods from both categories:  Heme Iron (Animal-Based) Red meat (beef, lamb, etc.)  Poultry (chicken, [place], especially dark meat)  Fish (tuna, sardines, salmon, haddock)  Shellfish (clams, oysters, mussels, shrimp)  Liver (beef, chicken, or pork liver)  Non-Heme Iron (Plant-Based) Legumes (lentils, chickpeas, beans, soybeans)  Tofu and other soy products  Spinach and other leafy greens (though the non-heme iron in spinach is less absorbed due to oxalates)  Quinoa  Fortified cereals (many breakfast cereals are iron-fortified)  Pumpkin seeds  Dried fruits (apricots, raisins, prunes)  Nuts and seeds (cashews, almonds, sunflower seeds)  Whole grains (brown rice, oats, barley)  Potatoes (especially with the skin)  Tips to Boost Iron Absorption: Vitamin C can help increase the absorption of non-heme iron. Consider pairing iron-rich foods with vitamin C-rich foods like citrus fruits, bell peppers, tomatoes, or broccoli.  Avoid drinking coffee or tea with meals, as they can inhibit iron absorption.

## 2023-11-08 NOTE — Progress Notes (Addendum)
 Complete physical exam  Patient: Christy Kelly   DOB: [birth year]   73 y.o. Female  MRN: [id number]  Subjective:    Chief Complaint  Patient presents with   Annual Exam    Christy Kelly is a 73 y.o. female who presents today for a complete physical exam. She reports consuming a  low calorie  diet.  She is active daily.  She generally feels well. She reports sleeping well. She does not have additional problems to discuss today.   History of Present Illness   Christy Kelly is a 73 year old female who presents for an annual physical exam. Her husband is concerned about her diet and encouraged her to discuss it during this visit.  She has a history of iron deficiency anemia, previously managed with iron infusion therapy. Recent blood work indicates a significant drop in iron saturation levels from 36% in 2020 to 7% currently, and her hemoglobin has decreased from 15.1 to 10.8. She has not been taking iron supplements since her last infusion due to absorption issues.  She feels tired and 'run down', requiring more frequent naps. No abnormal bleeding, bruising, dark tarry stools, or hemoptysis. 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  Her diet may be contributing to low iron levels, as she does not consume enough iron-rich foods. She avoids green leafy vegetables and red meats and is not taking a multivitamin.  She has been donating blood for over a year, with normal hemoglobin levels noted during her last two donations. She has not seen any specialists since her last visit in 2022.       Most recent fall risk assessment:    11/08/2023    8:40 AM  Fall Risk   Falls in the past year? 0  Number falls in past yr: 0  Injury with Fall? 0  Risk for fall due to : No Fall Risks  Follow up Falls evaluation completed     Most recent depression screenings:    11/08/2023    8:39 AM 10/08/2023   10:01 AM  PHQ 2/9 Scores  PHQ - 2 Score 0  0  PHQ- 9 Score 0 0    Vision:Within last year and Dental: No current dental problems and Receives regular dental care  Patient Active Problem List   Diagnosis Date Noted   Restless leg 01/17/2022   Lumbar radiculopathy 01/17/2022   Venous insufficiency 01/17/2022   Diverticulosis of duodenum 10/13/2021   Diverticular disease of colon 10/13/2021   Chronic superficial gastritis without bleeding 10/13/2021   Antral ulcer 10/13/2021   Acid reflux 10/13/2021   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   Past Surgical History:  Procedure Laterality Date   BUNIONECTOMY     x2 about 2012 bilateral.    COLONOSCOPY  03/29/2021   Attending MD: Kerin Salen, MD; Location: [facility]   HYSTEROSCOPY  2001   TONSILLECTOMY  1962   UPPER GI ENDOSCOPY  03/29/2021   Attending MD: Kerin Salen, MD; Location: [facility]   WISDOM TOOTH EXTRACTION     Social History   Tobacco Use   Smoking status: Never   Smokeless tobacco: Never  Vaping Use   Vaping status: Never Used  Substance Use Topics   Alcohol use: No   Drug use: No   Social History   Socioeconomic History   Marital status: Married    Spouse name: Brett Canales   Number of children: 2   Years of education: 13   Highest education level: 12th grade  Occupational History    Comment: [employer]  Tobacco Use   Smoking status: Never    Smokeless tobacco: Never  Vaping Use   Vaping status: Never Used  Substance and Sexual Activity   Alcohol use: No   Drug use: No   Sexual activity: Yes    Partners: Male  Other Topics Concern   Not on file  Social History Narrative   Marital status/children/pets: married, lives with husband.    Education/employment: 1 year of college-retired.  Worked as a Actor.   Safety:      -smoke alarm in the home:Yes     - wears seatbelt: Yes     - Feels safe in their relationships: Yes         Social Drivers of Corporate investment banker Strain: Low Risk  (10/07/2023)   Overall Financial Resource Strain (CARDIA)    Difficulty of Paying Living Expenses: Not hard at all  Food Insecurity: No Food Insecurity (10/07/2023)   Hunger Vital Sign    Worried About Running Out of Food in the Last Year: Never true    Ran Out of Food in the Last Year: Never true  Transportation Needs: No Transportation Needs (10/07/2023)   PRAPARE - Administrator, Civil Service (Medical): No    Lack of Transportation (Non-Medical): No  Physical Activity: Insufficiently Active (10/07/2023)   Exercise Vital Sign    Days of Exercise per Week: 2 days    Minutes of Exercise per Session: 30 min  Stress: No Stress Concern Present (10/07/2023)   [employer] of Occupational Health - Occupational Stress Questionnaire    Feeling of Stress : Not at all  Social Connections: Socially Integrated (10/07/2023)   Social Connection and Isolation Panel [NHANES]    Frequency of Communication with Friends and Family: More than three times a week    Frequency of Social Gatherings with Friends and Family: More than three times a week    Attends Religious Services: More than 4 times per year    Active Member of [employer] or Organizations: Yes    Attends Banker Meetings: More than 4 times per year    Marital Status: Married  Catering manager Violence: Not At Risk (10/15/2022)   Humiliation, Afraid,  Rape, and Kick questionnaire    Fear of Current or Ex-Partner: No    Emotionally Abused: No    Physically Abused: No    Sexually Abused: No   Family Status  Relation Name Status   Mother  Alive   Father  Deceased at age 3   Brother  Alive   Brother  Alive   Brother  Alive   Daughter  Alive   Son  Alive   MGM  Deceased   MGF  Deceased  PGM  Deceased   PGF  Deceased   Neg Hx  (Not Specified)  No partnership data on file   Family History  Problem Relation Age of Onset   Dementia Mother    Stroke Father    Early death Father    Hypertension Brother    Hypercholesterolemia Brother    Alcohol abuse Brother    Breast cancer Paternal Grandmother    Cancer Paternal Grandfather        Uncertain type- Li fraumeni gene   Colon cancer Neg Hx    Esophageal cancer Neg Hx    Rectal cancer Neg Hx    Stomach cancer Neg Hx    Allergies  Allergen Reactions   Codeine Itching, Nausea And Vomiting, Hives and Nausea Only   Hydrocodone Nausea Only    Other reaction(s): nausea      Patient Care Team: Sonny Masters, FNP as PCP - General (Family Medicine) Marcelle Overlie, MD as Consulting Physician (Obstetrics and Gynecology) Jonelle Sidle, MD as Consulting Physician (Cardiology) Marjory Lies, Glenford Bayley, MD as Consulting Physician (Neurology) Kerin Salen, MD as Consulting Physician (Gastroenterology) Delora Fuel, OD (Optometry)   Outpatient Medications Prior to Visit  Medication Sig   Alpha-Lipoic Acid 200 MG CAPS Take by mouth.   calcium carbonate (OS-CAL) 600 MG TABS tablet Take 600 mg by mouth 2 (two) times daily with a meal.   Cetirizine HCl (ZYRTEC PO) Take by mouth.   Cholecalciferol (VITAMIN D) 50 MCG (2000 UT) CAPS Take 1 capsule by mouth daily.   EPINEPHrine 0.3 mg/0.3 mL IJ SOAJ injection Inject 0.3 mg into the muscle as needed for anaphylaxis.   MAGNESIUM PO Take 500 mg by mouth daily.    melatonin 5 MG TABS Take 5 mg by mouth at bedtime as needed.   Omega-3  Fatty Acids (FISH OIL) 1000 MG CAPS Take 1,000 mg by mouth daily.   vitamin B-12 (CYANOCOBALAMIN) 250 MCG tablet Take 250 mcg by mouth daily.   albuterol (VENTOLIN HFA) 108 (90 Base) MCG/ACT inhaler Inhale 2 puffs into the lungs every 6 (six) hours as needed for wheezing or shortness of breath. (Patient not taking: Reported on 10/15/2022)   [DISCONTINUED] predniSONE (STERAPRED UNI-PAK 21 TAB) 10 MG (21) TBPK tablet Use as directed on back of pill pack (Patient not taking: Reported on 11/08/2023)   [DISCONTINUED] methylPREDNISolone acetate (DEPO-MEDROL) injection 40 mg    No facility-administered medications prior to visit.    ROS per HPI      Objective:     BP 116/68   Pulse 93   Temp 98.4 F (36.9 C) (Temporal)   Ht 5' 8.5" (1.74 m)   Wt 157 lb 3.2 oz (71.3 kg)   SpO2 96%   BMI 23.55 kg/m  BP Readings from Last 3 Encounters:  11/08/23 116/68  10/08/23 138/83  04/09/23 120/70   Wt Readings from Last 3 Encounters:  11/08/23 157 lb 3.2 oz (71.3 kg)  10/08/23 160 lb 12.8 oz (72.9 kg)  04/09/23 162 lb 9.6 oz (73.8 kg)   SpO2 Readings from Last 3 Encounters:  11/08/23 96%  10/08/23 98%  04/09/23 98%      Physical Exam Vitals and nursing note reviewed.  Constitutional:      General: She is not in acute distress.    Appearance: Normal appearance. She is well-developed, well-groomed and normal weight. She is not ill-appearing, toxic-appearing or diaphoretic.  HENT:     Head: Normocephalic and atraumatic.     Jaw: There  is normal jaw occlusion.     Right Ear: Hearing, tympanic membrane, ear canal and external ear normal.     Left Ear: Hearing, tympanic membrane, ear canal and external ear normal.     Nose: Nose normal.     Mouth/Throat:     Lips: Pink.     Mouth: Mucous membranes are moist.     Pharynx: Oropharynx is clear. Uvula midline.  Eyes:     General: Lids are normal.     Extraocular Movements: Extraocular movements intact.     Conjunctiva/sclera: Conjunctivae  normal.     Pupils: Pupils are equal, round, and reactive to light.  Neck:     Thyroid: No thyroid mass, thyromegaly or thyroid tenderness.     Vascular: No carotid bruit or JVD.     Trachea: Trachea and phonation normal.  Cardiovascular:     Rate and Rhythm: Normal rate and regular rhythm.     Chest Wall: PMI is not displaced.     Pulses: Normal pulses.     Heart sounds: Normal heart sounds. No murmur heard.    No friction rub. No gallop.  Pulmonary:     Effort: Pulmonary effort is normal. No respiratory distress.     Breath sounds: Normal breath sounds. No wheezing.  Abdominal:     General: Bowel sounds are normal. There is no distension or abdominal bruit.     Palpations: Abdomen is soft. There is no hepatomegaly or splenomegaly.     Tenderness: There is no abdominal tenderness. There is no right CVA tenderness or left CVA tenderness.     Hernia: No hernia is present.  Musculoskeletal:        General: Normal range of motion.     Cervical back: Normal range of motion and neck supple.     Right lower leg: No edema.     Left lower leg: No edema.  Lymphadenopathy:     Cervical: No cervical adenopathy.  Skin:    General: Skin is warm and dry.     Capillary Refill: Capillary refill takes less than 2 seconds.     Coloration: Skin is not cyanotic, jaundiced or pale.     Findings: No rash.  Neurological:     General: No focal deficit present.     Mental Status: She is alert and oriented to person, place, and time.     Sensory: Sensation is intact.     Motor: Motor function is intact.     Coordination: Coordination is intact.     Gait: Gait is intact.     Deep Tendon Reflexes: Reflexes are normal and symmetric.  Psychiatric:        Attention and Perception: Attention and perception normal.        Mood and Affect: Mood and affect normal.        Speech: Speech normal.        Behavior: Behavior normal. Behavior is cooperative.        Thought Content: Thought content normal.         Cognition and Memory: Cognition and memory normal.        Judgment: Judgment normal.      Last CBC Lab Results  Component Value Date   WBC 3.7 11/06/2023   HGB 10.8 (L) 11/06/2023   HCT 34.2 11/06/2023   MCV 85 11/06/2023   MCH 26.9 11/06/2023   RDW 14.3 11/06/2023   PLT 208 11/06/2023   Last metabolic panel Lab Results  Component Value  Date   GLUCOSE 85 11/06/2023   NA 142 11/06/2023   K 4.3 11/06/2023   CL 106 11/06/2023   CO2 23 11/06/2023   BUN 14 11/06/2023   CREATININE 0.95 11/06/2023   EGFR 64 11/06/2023   CALCIUM 9.1 11/06/2023   PROT 6.6 11/06/2023   ALBUMIN 4.1 11/06/2023   LABGLOB 2.5 11/06/2023   AGRATIO 1.6 05/25/2022   BILITOT 0.3 11/06/2023   ALKPHOS 99 11/06/2023   AST 22 11/06/2023   ALT 21 11/06/2023   ANIONGAP 7 06/26/2021   Last lipids Lab Results  Component Value Date   CHOL 196 11/06/2023   HDL 53 11/06/2023   LDLCALC 124 (H) 11/06/2023   LDLDIRECT 111.0 05/26/2021   TRIG 108 11/06/2023   CHOLHDL 3.7 11/06/2023   Last hemoglobin A1c Lab Results  Component Value Date   HGBA1C 6.0 05/26/2021   Last thyroid functions Lab Results  Component Value Date   TSH 3.540 11/06/2023   T4TOTAL 8.2 11/06/2023   Last vitamin D Lab Results  Component Value Date   VD25OH 63.3 11/06/2023   Last vitamin B12 and Folate Lab Results  Component Value Date   VITAMINB12 612 11/06/2023   FOLATE 10.1 11/06/2023        Assessment & Plan:    Routine Health Maintenance and Physical Exam  Immunization History  Administered Date(s) Administered   Fluad Quad(high Dose 65+) 04/07/2019, 05/26/2020, 05/26/2021, 05/08/2022   Moderna Sars-Covid-2 Vaccination 10/01/2019, 10/30/2019   Pneumococcal Conjugate-13 05/26/2020   Pneumococcal Polysaccharide-23 08/23/2018   Tdap 08/31/2006   Zoster Recombinant(Shingrix) 08/23/2018, 01/23/2019    Health Maintenance  Topic Date Due   DTaP/Tdap/Td (2 - Td or Tdap) 08/31/2016   DEXA SCAN  08/15/2020    Medicare Annual Wellness (AWV)  10/15/2023   COVID-19 Vaccine (3 - Moderna risk series) 11/24/2023 (Originally 11/27/2019)   INFLUENZA VACCINE  03/06/2024   Colonoscopy  03/29/2024   MAMMOGRAM  05/14/2025   Pneumonia Vaccine 3+ Years old  Completed   Hepatitis C Screening  Completed   Zoster Vaccines- Shingrix  Completed   HPV VACCINES  Aged Out    Discussed health benefits of physical activity, and encouraged her to engage in regular exercise appropriate for her age and condition.  Problem List Items Addressed This Visit       Other   Iron deficiency anemia due to chronic blood loss   Relevant Orders   Ambulatory referral to Hematology / Oncology   Other Visit Diagnoses       Annual physical exam    -  Primary   Relevant Orders   DG [facility] DEXA     Screening for colorectal cancer         Encounter for osteoporosis screening in asymptomatic postmenopausal patient       Relevant Orders   DG [facility] DEXA     Encounter for screening mammogram for malignant neoplasm of breast         Vitamin B 12 deficiency         Vitamin D deficiency         Assessment and Plan    Iron Deficiency Anemia Iron deficiency anemia with significant drop in iron saturation to 7% (normal range 15-55%) and hemoglobin level of 10.8 g/dL, down from 16.1 g/dL. Symptoms include fatigue and increased napping. No abnormal bleeding, bruising, or gastrointestinal symptoms. Iron malabsorption and inadequate dietary iron intake may contribute to anemia. Referral to hematology for evaluation and potential iron infusion is warranted. -  Refer to hematology for evaluation and potential iron infusion - Provide fecal occult blood test kit to check for gastrointestinal bleeding - Advise increased dietary intake of iron-rich foods such as red meats and green leafy vegetables - Recommend cooking with a cast iron pan to increase iron intake - Suggest taking a multivitamin with iron  General Health Maintenance General  health maintenance is up to date except for tetanus vaccine and bone density scan. Cholesterol levels are within acceptable limits with slightly elevated LDL but good HDL ratio. Thyroid, kidney, and liver functions are normal. Due for colonoscopy and mammogram later in the year. Declined tetanus vaccine due to cost. - Perform bone density scan (DEXA) - Discuss tetanus vaccination; declined due to cost - Schedule colonoscopy for later in the year - Schedule mammogram for later in the year       Return in about 1 year (around 11/07/2024) for Annual Physical.     Kari Baars, FNP

## 2023-11-11 ENCOUNTER — Other Ambulatory Visit

## 2023-11-11 DIAGNOSIS — E611 Iron deficiency: Secondary | ICD-10-CM

## 2023-11-12 DIAGNOSIS — M5416 Radiculopathy, lumbar region: Secondary | ICD-10-CM | POA: Diagnosis not present

## 2023-11-12 LAB — FECAL OCCULT BLOOD, IMMUNOCHEMICAL: Fecal Occult Bld: NEGATIVE

## 2023-11-13 ENCOUNTER — Telehealth: Payer: Self-pay

## 2023-11-13 DIAGNOSIS — M546 Pain in thoracic spine: Secondary | ICD-10-CM | POA: Diagnosis not present

## 2023-11-13 NOTE — Telephone Encounter (Signed)
Refer to lab result  

## 2023-11-13 NOTE — Telephone Encounter (Signed)
 Copied from CRM [id number]. Topic: Clinical - Lab/Test Results >> Nov 13, 2023  1:39 PM Shelah Lewandowsky wrote: Reason for CRM: returning call from office about lab results- please call [phone]

## 2023-11-14 ENCOUNTER — Other Ambulatory Visit: Payer: Self-pay

## 2023-11-14 ENCOUNTER — Encounter: Payer: Self-pay | Admitting: Hematology & Oncology

## 2023-11-14 ENCOUNTER — Inpatient Hospital Stay

## 2023-11-14 ENCOUNTER — Inpatient Hospital Stay: Attending: Hematology & Oncology

## 2023-11-14 ENCOUNTER — Inpatient Hospital Stay: Admitting: Hematology & Oncology

## 2023-11-14 VITALS — BP 117/60 | HR 82 | Temp 97.4°F | Resp 18

## 2023-11-14 VITALS — BP 129/77 | HR 76 | Temp 98.1°F | Resp 18 | Ht 68.5 in | Wt 157.0 lb

## 2023-11-14 DIAGNOSIS — D500 Iron deficiency anemia secondary to blood loss (chronic): Secondary | ICD-10-CM

## 2023-11-14 DIAGNOSIS — D509 Iron deficiency anemia, unspecified: Secondary | ICD-10-CM

## 2023-11-14 DIAGNOSIS — K909 Intestinal malabsorption, unspecified: Secondary | ICD-10-CM

## 2023-11-14 LAB — CMP (CANCER CENTER ONLY)
ALT: 20 U/L (ref 0–44)
AST: 23 U/L (ref 15–41)
Albumin: 4.4 g/dL (ref 3.5–5.0)
Alkaline Phosphatase: 85 U/L (ref 38–126)
Anion gap: 8 (ref 5–15)
BUN: 12 mg/dL (ref 8–23)
CO2: 28 mmol/L (ref 22–32)
Calcium: 10 mg/dL (ref 8.9–10.3)
Chloride: 103 mmol/L (ref 98–111)
Creatinine: 0.89 mg/dL (ref 0.44–1.00)
GFR, Estimated: >60 mL/min (ref >60)
Glucose, Bld: 87 mg/dL (ref 70–99)
Potassium: 4.4 mmol/L (ref 3.5–5.1)
Sodium: 139 mmol/L (ref 135–145)
Total Bilirubin: 0.6 mg/dL (ref 0.0–1.2)
Total Protein: 7.2 g/dL (ref 6.5–8.1)

## 2023-11-14 LAB — CBC WITH DIFFERENTIAL (CANCER CENTER ONLY)
Abs Immature Granulocytes: 0.02 10*3/uL (ref 0.00–0.07)
Basophils Absolute: 0.1 10*3/uL (ref 0.0–0.1)
Basophils Relative: 1 %
Eosinophils Absolute: 0 10*3/uL (ref 0.0–0.5)
Eosinophils Relative: 1 %
HCT: 35.8 % — ABNORMAL LOW (ref 36.0–46.0)
Hemoglobin: 11.6 g/dL — ABNORMAL LOW (ref 12.0–15.0)
Immature Granulocytes: 0 %
Lymphocytes Relative: 38 %
Lymphs Abs: 2 10*3/uL (ref 0.7–4.0)
MCH: 27.1 pg (ref 26.0–34.0)
MCHC: 32.4 g/dL (ref 30.0–36.0)
MCV: 83.6 fL (ref 80.0–100.0)
Monocytes Absolute: 0.5 10*3/uL (ref 0.1–1.0)
Monocytes Relative: 10 %
Neutro Abs: 2.7 10*3/uL (ref 1.7–7.7)
Neutrophils Relative %: 50 %
Platelet Count: 230 10*3/uL (ref 150–400)
RBC: 4.28 MIL/uL (ref 3.87–5.11)
RDW: 14.5 % (ref 11.5–15.5)
WBC Count: 5.3 10*3/uL (ref 4.0–10.5)
nRBC: 0 % (ref 0.0–0.2)

## 2023-11-14 LAB — RETICULOCYTES
Immature Retic Fract: 15.2 % (ref 2.3–15.9)
RBC.: 4.27 MIL/uL (ref 3.87–5.11)
Retic Count, Absolute: 55.1 10*3/uL (ref 19.0–186.0)
Retic Ct Pct: 1.3 % (ref 0.4–3.1)

## 2023-11-14 LAB — FERRITIN: Ferritin: 5 ng/mL — ABNORMAL LOW (ref 11–307)

## 2023-11-14 MED ORDER — SODIUM CHLORIDE 0.9 % IV SOLN: INTRAVENOUS | Status: DC

## 2023-11-14 MED ORDER — SODIUM CHLORIDE 0.9 % IV SOLN
510.0000 mg | Freq: Once | INTRAVENOUS | Status: AC
  Administered 2023-11-14: 510 mg via INTRAVENOUS
  Filled 2023-11-14: qty 17

## 2023-11-14 NOTE — Patient Instructions (Signed)
Iron Deficiency Anemia, Adult  Iron deficiency anemia is a condition in which the concentration of red blood cells or hemoglobin in the blood is below normal because of too little iron. Hemoglobin is a substance in red blood cells that carries oxygen to the body's tissues. When the concentration of red blood cells or hemoglobin is too low, not enough oxygen reaches these tissues. Iron deficiency anemia is usually long-lasting, and it develops over time. It may or may not cause symptoms. It is a common type of anemia. What are the causes? This condition may be caused by: Not enough iron in the diet. Abnormal absorption in the gut. Blood loss. What increases the risk? You are more likely to develop this condition if you get menstrual periods (menstruate) or are pregnant. What are the signs or symptoms? Symptoms of this condition may include: Pale skin, lips, and nail beds. Weakness, dizziness, and getting tired easily. Shortness of breath when moving or exercising. Cold hands or feet. Mild anemia may not cause any symptoms. How is this diagnosed? This condition is diagnosed based on: Your medical history. A physical exam. Blood tests. How is this treated? This condition is treated by correcting the cause of your iron deficiency. Treatment may involve: Adding iron-rich foods to your diet. Taking iron supplements. If you are pregnant or breastfeeding, you may need to take extra iron because your normal diet usually does not provide the amount of iron that you need. Increasing vitamin C intake. Vitamin C helps your body absorb iron. Your health care provider may recommend that you take iron supplements along with a glass of orange juice or a vitamin C supplement. Medicines to make heavy menstrual flow lighter. Surgery or additional testing procedures to determine the cause of your anemia. You may need repeat blood tests to determine whether treatment is working. If the treatment does not  seem to be working, you may need more tests. Follow these instructions at home: Medicines Take over-the-counter and prescription medicines only as told by your health care provider. This includes iron supplements and vitamins. This is important because too much iron can be harmful. For the best iron absorption, you should take iron supplements when your stomach is empty. If you cannot tolerate them on an empty stomach, you may need to take them with food. Do not drink milk or take antacids at the same time as your iron supplements. Milk and antacids may interfere with how your body absorbs iron. Iron supplements may turn stool (feces) a darker color and it may appear black. If you cannot tolerate taking iron supplements by mouth, talk with your health care provider about taking them through an IV or through an injection into a muscle. Eating and drinking Talk with your health care provider before changing your diet. Your provider may recommend that you eat foods that contain a lot of iron, such as: Liver. Low-fat (lean) beef. Breads and cereals that have iron added to them (are fortified). Eggs. Dried fruit. Dark green, leafy vegetables. To help your body use the iron from iron-rich foods, eat those foods at the same time as fresh fruits and vegetables that are high in vitamin C. Foods that are high in vitamin C include: Oranges. Peppers. Tomatoes. Mangoes. Managing constipation If you are taking an iron supplement, it may cause constipation. To prevent or treat constipation, you may need to: Drink enough fluid to keep your urine pale yellow. Take over-the-counter or prescription medicines. Eat foods that are high in fiber, such   as beans, whole grains, and fresh fruits and vegetables. Limit foods that are high in fat and processed sugars, such as fried or sweet foods. General instructions Return to your normal activities as told by your health care provider. Ask your health care provider  what activities are safe for you. Keep all follow-up visits. Contact a health care provider if: You feel nauseous or you vomit. You feel weak. You become light-headed when getting up from a sitting or lying down position. You have unexplained sweating. You develop symptoms of constipation. You have a heaviness in your chest. You have trouble breathing with physical activity. Get help right away if: You faint. If this happens, do not drive yourself to the hospital. You have an irregular or rapid heartbeat. Summary Iron deficiency anemia is a condition in which the concentration of red blood cells or hemoglobin in the blood is below normal because of too little iron. This condition is treated by correcting the cause of your iron deficiency. Take over-the-counter and prescription medicines only as told by your health care provider. This includes iron supplements and vitamins. To help your body use the iron from iron-rich foods, eat those foods at the same time as fresh fruits and vegetables that are high in vitamin C. Seek medical help if you have signs or symptoms of worsening anemia. This information is not intended to replace advice given to you by your health care provider. Make sure you discuss any questions you have with your health care provider. Document Revised: 08/30/2021 Document Reviewed: 08/30/2021 Elsevier Patient Education  2024 [employer]  

## 2023-11-14 NOTE — Progress Notes (Signed)
 Hematology and Oncology Follow Up Visit  DORANN DAVIDSON [id number] 1950/10/04 73 y.o. 11/14/2023   Principle Diagnosis:  Iron deficiency anemia-patient donating blood.  Current Therapy:   Feraheme 510 mg IV x 2 doses.  First dose given on 11/14/2023     Interim History:  Ms. Leveille is back for a long way to follow-up.  Last time I saw her was on the back in November 2022.  At that time, she had iron deficiency anemia.  We gave her Monoferric which helped quite a bit.  Since then, she has been feeling okay.  She actually has been donating blood to the [employer].  Her blood type is A+.  She has been more tired.  She saw her family doctor.  She was found to be iron deficient.  Her hemoglobin was 10.8 with hematocrit of 34.2.  Her white cell count is 3.7 with a platelet count 208,000.  Her iron saturation was 7% with a ferritin of 10.  She has had no bleeding.  She has stools that were checked which were heme negative.  She has had no problem with COVID.  She has had no cough or shortness of breath.  There has been no rashes.  She has had no nausea or vomiting.  She is not a vegetarian.  She is getting ready to go out to [place] at the end of April.  She wants to feel better when she goes out.  Currently, I would say that her performance status is ECOG 0.  Medications:  Current Outpatient Medications:    albuterol (VENTOLIN HFA) 108 (90 Base) MCG/ACT inhaler, Inhale 2 puffs into the lungs every 6 (six) hours as needed for wheezing or shortness of breath. (Patient not taking: Reported on 10/15/2022), Disp: 8 g, Rfl: 2   Alpha-Lipoic Acid 200 MG CAPS, Take by mouth., Disp: , Rfl:    calcium carbonate (OS-CAL) 600 MG TABS tablet, Take 600 mg by mouth 2 (two) times daily with a meal., Disp: , Rfl:    Cetirizine HCl (ZYRTEC PO), Take by mouth., Disp: , Rfl:    Cholecalciferol (VITAMIN D) 50 MCG (2000 UT) CAPS, Take 1 capsule by mouth daily., Disp: , Rfl:    EPINEPHrine 0.3 mg/0.3 mL IJ  SOAJ injection, Inject 0.3 mg into the muscle as needed for anaphylaxis., Disp: 1 each, Rfl: 11   MAGNESIUM PO, Take 500 mg by mouth daily. , Disp: , Rfl:    melatonin 5 MG TABS, Take 5 mg by mouth at bedtime as needed., Disp: , Rfl:    Omega-3 Fatty Acids (FISH OIL) 1000 MG CAPS, Take 1,000 mg by mouth daily., Disp: , Rfl:    vitamin B-12 (CYANOCOBALAMIN) 250 MCG tablet, Take 250 mcg by mouth daily., Disp: , Rfl:  No current facility-administered medications for this visit.  Facility-Administered Medications Ordered in Other Visits:    0.9 %  sodium chloride infusion, , Intravenous, Continuous, Teresha Hanks, Rose Phi, MD, Last Rate: 10 mL/hr at 11/14/23 1448, New Bag at 11/14/23 1448   ferumoxytol (FERAHEME) 510 mg in sodium chloride 0.9 % 100 mL IVPB, 510 mg, Intravenous, Once, Jevin Camino, Rose Phi, MD  Allergies:  Allergies  Allergen Reactions   Codeine Itching, Nausea And Vomiting, Hives and Nausea Only   Hydrocodone Nausea Only    Other reaction(s): nausea    Past Medical History, Surgical history, Social history, and Family History were reviewed and updated.  Review of Systems: Review of Systems  Constitutional:  Positive for fatigue.  HENT:  Negative.    Eyes: Negative.   Respiratory: Negative.    Cardiovascular: Negative.   Gastrointestinal: Negative.   Endocrine: Negative.   Genitourinary: Negative.    Musculoskeletal: Negative.   Skin: Negative.   Neurological: Negative.   Hematological: Negative.   Psychiatric/Behavioral: Negative.      Physical Exam:  height is 5' 8.5" (1.74 m) and weight is 157 lb (71.2 kg). Her oral temperature is 98.1 F (36.7 C). Her blood pressure is 129/77 and her pulse is 76. Her respiration is 18 and oxygen saturation is 100%.   Wt Readings from Last 3 Encounters:  11/14/23 157 lb (71.2 kg)  11/08/23 157 lb 3.2 oz (71.3 kg)  10/08/23 160 lb 12.8 oz (72.9 kg)    Physical Exam Vitals reviewed.  HENT:     Head: Normocephalic and atraumatic.   Eyes:     Pupils: Pupils are equal, round, and reactive to light.  Cardiovascular:     Rate and Rhythm: Normal rate and regular rhythm.     Heart sounds: Normal heart sounds.  Pulmonary:     Effort: Pulmonary effort is normal.     Breath sounds: Normal breath sounds.  Abdominal:     General: Bowel sounds are normal.     Palpations: Abdomen is soft.  Musculoskeletal:        General: No tenderness or deformity. Normal range of motion.     Cervical back: Normal range of motion.  Lymphadenopathy:     Cervical: No cervical adenopathy.  Skin:    General: Skin is warm and dry.     Findings: No erythema or rash.  Neurological:     Mental Status: She is alert and oriented to person, place, and time.  Psychiatric:        Behavior: Behavior normal.        Thought Content: Thought content normal.        Judgment: Judgment normal.      Lab Results  Component Value Date   WBC 5.3 11/14/2023   HGB 11.6 (L) 11/14/2023   HCT 35.8 (L) 11/14/2023   MCV 83.6 11/14/2023   PLT 230 11/14/2023     Chemistry      Component Value Date/Time   NA 139 11/14/2023 1346   NA 142 11/06/2023 0938   K 4.4 11/14/2023 1346   CL 103 11/14/2023 1346   CO2 28 11/14/2023 1346   BUN 12 11/14/2023 1346   BUN 14 11/06/2023 0938   CREATININE 0.89 11/14/2023 1346   CREATININE 1.02 04/28/2021 1402   GLU 76 03/10/2021 0000      Component Value Date/Time   CALCIUM 10.0 11/14/2023 1346   ALKPHOS 85 11/14/2023 1346   AST 23 11/14/2023 1346   ALT 20 11/14/2023 1346   BILITOT 0.6 11/14/2023 1346      Impression and Plan: Ms. Villeda is a very nice 73 year old white female.  She has had a history of iron deficiency in the past.  Again, she is donating blood.  I told that she probably needs to hold off on blood donation for right now.  We will go ahead and give her Feraheme.  I assured her iron saturation will be on the low side.  I would like to give her 2 doses before she goes out to [place].  I  would like to try to get her to feel better.  I would like to see her back in late May.  By then, she would have  had the 2 doses of iron and we can see how well the iron has been absorbed.   Josph Macho, MD 4/10/20252:48 PM

## 2023-11-15 LAB — IRON AND IRON BINDING CAPACITY (CC-WL,HP ONLY)
Iron: 34 ug/dL (ref 28–170)
Saturation Ratios: 6 % — ABNORMAL LOW (ref 10.4–31.8)
TIBC: 529 ug/dL — ABNORMAL HIGH (ref 250–450)
UIBC: 495 ug/dL — ABNORMAL HIGH (ref 148–442)

## 2023-11-21 ENCOUNTER — Inpatient Hospital Stay

## 2023-11-21 VITALS — BP 117/60 | HR 70 | Temp 98.2°F | Resp 18

## 2023-11-21 DIAGNOSIS — D500 Iron deficiency anemia secondary to blood loss (chronic): Secondary | ICD-10-CM

## 2023-11-21 DIAGNOSIS — D509 Iron deficiency anemia, unspecified: Secondary | ICD-10-CM | POA: Diagnosis not present

## 2023-11-21 DIAGNOSIS — K909 Intestinal malabsorption, unspecified: Secondary | ICD-10-CM

## 2023-11-21 MED ORDER — SODIUM CHLORIDE 0.9% FLUSH: 3.0000 mL | Freq: Once | INTRAVENOUS | Status: DC | PRN

## 2023-11-21 MED ORDER — HEPARIN SOD (PORK) LOCK FLUSH 100 UNIT/ML IV SOLN
250.0000 [IU] | Freq: Once | INTRAVENOUS | Status: DC | PRN
Start: 2023-11-21 — End: 2023-11-21

## 2023-11-21 MED ORDER — DIPHENHYDRAMINE HCL 50 MG/ML IJ SOLN: 50.0000 mg | Freq: Once | INTRAMUSCULAR | Status: DC | PRN

## 2023-11-21 MED ORDER — HEPARIN SOD (PORK) LOCK FLUSH 100 UNIT/ML IV SOLN
500.0000 [IU] | Freq: Once | INTRAVENOUS | Status: DC | PRN
Start: 2023-11-21 — End: 2023-11-21

## 2023-11-21 MED ORDER — FAMOTIDINE IN NACL 20-0.9 MG/50ML-% IV SOLN: 20.0000 mg | Freq: Once | INTRAVENOUS | Status: DC | PRN

## 2023-11-21 MED ORDER — CETIRIZINE HCL 10 MG PO TABS
10.0000 mg | ORAL_TABLET | Freq: Once | ORAL | Status: DC
  Filled 2023-11-21: qty 1

## 2023-11-21 MED ORDER — ACETAMINOPHEN 325 MG PO TABS: 650.0000 mg | ORAL_TABLET | Freq: Once | ORAL | Status: DC

## 2023-11-21 MED ORDER — ALTEPLASE 2 MG IJ SOLR: 2.0000 mg | Freq: Once | INTRAMUSCULAR | Status: DC | PRN

## 2023-11-21 MED ORDER — SODIUM CHLORIDE 0.9% FLUSH
10.0000 mL | Freq: Once | INTRAVENOUS | Status: DC | PRN
Start: 2023-11-21 — End: 2023-11-21

## 2023-11-21 MED ORDER — SODIUM CHLORIDE 0.9 % IV SOLN: Freq: Once | INTRAVENOUS | Status: AC

## 2023-11-21 MED ORDER — ALBUTEROL SULFATE HFA 108 (90 BASE) MCG/ACT IN AERS: 2.0000 | INHALATION_SPRAY | Freq: Once | RESPIRATORY_TRACT | Status: DC | PRN

## 2023-11-21 MED ORDER — EPINEPHRINE 0.3 MG/0.3ML IJ SOAJ: 0.3000 mg | Freq: Once | INTRAMUSCULAR | Status: DC | PRN

## 2023-11-21 MED ORDER — METHYLPREDNISOLONE SODIUM SUCC 125 MG IJ SOLR: 125.0000 mg | Freq: Once | INTRAMUSCULAR | Status: DC | PRN

## 2023-11-21 MED ORDER — SODIUM CHLORIDE 0.9 % IV SOLN
510.0000 mg | Freq: Once | INTRAVENOUS | Status: AC
  Administered 2023-11-21: 510 mg via INTRAVENOUS
  Filled 2023-11-21: qty 17

## 2023-11-21 NOTE — Patient Instructions (Addendum)
 Please call the office if you have any problems.     Ferumoxytol Injection What is this medication? FERUMOXYTOL (FER ue MOX i tol) treats low levels of iron in your body (iron deficiency anemia). Iron is a mineral that plays an important role in making red blood cells, which carry oxygen from your lungs to the rest of your body. This medicine may be used for other purposes; ask your health care provider or pharmacist if you have questions. COMMON BRAND NAME(S): Feraheme What should I tell my care team before I take this medication? They need to know if you have any of these conditions: Anemia not caused by low iron levels High levels of iron in the blood Magnetic resonance imaging (MRI) test scheduled An unusual or allergic reaction to iron, other medications, foods, dyes, or preservatives Pregnant or trying to get pregnant Breastfeeding How should I use this medication? This medication is injected into a vein. It is given by your care team in a hospital or clinic setting. Talk to your care team the use of this medication in children. Special care may be needed. Overdosage: If you think you have taken too much of this medicine contact a poison control center or emergency room at once. NOTE: This medicine is only for you. Do not share this medicine with others. What if I miss a dose? It is important not to miss your dose. Call your care team if you are unable to keep an appointment. What may interact with this medication? Other iron products This list may not describe all possible interactions. Give your health care provider a list of all the medicines, herbs, non-prescription drugs, or dietary supplements you use. Also tell them if you smoke, drink alcohol, or use illegal drugs. Some items may interact with your medicine. What should I watch for while using this medication? Visit your care team for regular checks on your progress. Tell your care team if your symptoms do not start to get  better or if they get worse. You may need blood work done while you are taking this medication. You may need to eat more foods that contain iron. Talk to your care team. Foods that contain iron include whole grains or cereals, dried fruits, beans, peas, leafy green vegetables, and organ meats (liver, kidney). What side effects may I notice from receiving this medication? Side effects that you should report to your care team as soon as possible: Allergic reactions--skin rash, itching, hives, swelling of the face, lips, tongue, or throat Low blood pressure--dizziness, feeling faint or lightheaded, blurry vision Shortness of breath Side effects that usually do not require medical attention (report to your care team if they continue or are bothersome): Flushing Headache Joint pain Muscle pain Nausea Pain, redness, or irritation at injection site This list may not describe all possible side effects. Call your doctor for medical advice about side effects. You may report side effects to FDA at 1-800-FDA-1088. Where should I keep my medication? This medication is given in a hospital or clinic. It will not be stored at home. NOTE: This sheet is a summary. It may not cover all possible information. If you have questions about this medicine, talk to your doctor, pharmacist, or health care provider.  2024 Elsevier/Gold Standard (2023-03-13 00:00:00)

## 2023-12-12 DIAGNOSIS — M79605 Pain in left leg: Secondary | ICD-10-CM | POA: Diagnosis not present

## 2023-12-12 DIAGNOSIS — M47816 Spondylosis without myelopathy or radiculopathy, lumbar region: Secondary | ICD-10-CM | POA: Diagnosis not present

## 2023-12-12 DIAGNOSIS — M47817 Spondylosis without myelopathy or radiculopathy, lumbosacral region: Secondary | ICD-10-CM | POA: Diagnosis not present

## 2023-12-12 DIAGNOSIS — M79604 Pain in right leg: Secondary | ICD-10-CM | POA: Diagnosis not present

## 2023-12-12 DIAGNOSIS — M5416 Radiculopathy, lumbar region: Secondary | ICD-10-CM | POA: Diagnosis not present

## 2023-12-25 DIAGNOSIS — M546 Pain in thoracic spine: Secondary | ICD-10-CM | POA: Diagnosis not present

## 2023-12-26 ENCOUNTER — Ambulatory Visit: Payer: Self-pay | Admitting: Hematology & Oncology

## 2023-12-26 ENCOUNTER — Inpatient Hospital Stay (HOSPITAL_BASED_OUTPATIENT_CLINIC_OR_DEPARTMENT_OTHER): Admitting: Hematology & Oncology

## 2023-12-26 ENCOUNTER — Other Ambulatory Visit: Payer: Self-pay

## 2023-12-26 ENCOUNTER — Inpatient Hospital Stay: Attending: Hematology & Oncology

## 2023-12-26 VITALS — BP 126/75 | HR 89 | Temp 98.3°F | Resp 18 | Ht 68.5 in | Wt 161.0 lb

## 2023-12-26 DIAGNOSIS — M549 Dorsalgia, unspecified: Secondary | ICD-10-CM | POA: Diagnosis not present

## 2023-12-26 DIAGNOSIS — D509 Iron deficiency anemia, unspecified: Secondary | ICD-10-CM | POA: Diagnosis not present

## 2023-12-26 DIAGNOSIS — M5416 Radiculopathy, lumbar region: Secondary | ICD-10-CM | POA: Diagnosis not present

## 2023-12-26 DIAGNOSIS — D500 Iron deficiency anemia secondary to blood loss (chronic): Secondary | ICD-10-CM | POA: Diagnosis not present

## 2023-12-26 DIAGNOSIS — R5383 Other fatigue: Secondary | ICD-10-CM | POA: Diagnosis not present

## 2023-12-26 DIAGNOSIS — K909 Intestinal malabsorption, unspecified: Secondary | ICD-10-CM

## 2023-12-26 LAB — CBC WITH DIFFERENTIAL (CANCER CENTER ONLY)
Abs Immature Granulocytes: 0.03 10*3/uL (ref 0.00–0.07)
Basophils Absolute: 0.1 10*3/uL (ref 0.0–0.1)
Basophils Relative: 1 %
Eosinophils Absolute: 0 10*3/uL (ref 0.0–0.5)
Eosinophils Relative: 1 %
HCT: 43.5 % (ref 36.0–46.0)
Hemoglobin: 14.7 g/dL (ref 12.0–15.0)
Immature Granulocytes: 0 %
Lymphocytes Relative: 22 %
Lymphs Abs: 1.9 10*3/uL (ref 0.7–4.0)
MCH: 30 pg (ref 26.0–34.0)
MCHC: 33.8 g/dL (ref 30.0–36.0)
MCV: 88.8 fL (ref 80.0–100.0)
Monocytes Absolute: 0.8 10*3/uL (ref 0.1–1.0)
Monocytes Relative: 9 %
Neutro Abs: 5.8 10*3/uL (ref 1.7–7.7)
Neutrophils Relative %: 67 %
Platelet Count: 206 10*3/uL (ref 150–400)
RBC: 4.9 MIL/uL (ref 3.87–5.11)
RDW: 18.3 % — ABNORMAL HIGH (ref 11.5–15.5)
WBC Count: 8.6 10*3/uL (ref 4.0–10.5)
nRBC: 0 % (ref 0.0–0.2)

## 2023-12-26 LAB — RETICULOCYTES
Immature Retic Fract: 10 % (ref 2.3–15.9)
RBC.: 4.99 MIL/uL (ref 3.87–5.11)
Retic Count, Absolute: 69.9 10*3/uL (ref 19.0–186.0)
Retic Ct Pct: 1.4 % (ref 0.4–3.1)

## 2023-12-26 LAB — FERRITIN: Ferritin: 180 ng/mL (ref 11–307)

## 2023-12-26 LAB — CMP (CANCER CENTER ONLY)
ALT: 23 U/L (ref 0–44)
AST: 25 U/L (ref 15–41)
Albumin: 4.9 g/dL (ref 3.5–5.0)
Alkaline Phosphatase: 91 U/L (ref 38–126)
Anion gap: 8 (ref 5–15)
BUN: 12 mg/dL (ref 8–23)
CO2: 29 mmol/L (ref 22–32)
Calcium: 9.8 mg/dL (ref 8.9–10.3)
Chloride: 103 mmol/L (ref 98–111)
Creatinine: 1.08 mg/dL — ABNORMAL HIGH (ref 0.44–1.00)
GFR, Estimated: 55 mL/min — ABNORMAL LOW (ref >60)
Glucose, Bld: 98 mg/dL (ref 70–99)
Potassium: 4.2 mmol/L (ref 3.5–5.1)
Sodium: 140 mmol/L (ref 135–145)
Total Bilirubin: 0.6 mg/dL (ref 0.0–1.2)
Total Protein: 7.1 g/dL (ref 6.5–8.1)

## 2023-12-26 LAB — IRON AND IRON BINDING CAPACITY (CC-WL,HP ONLY)
Iron: 74 ug/dL (ref 28–170)
Saturation Ratios: 21 % (ref 10.4–31.8)
TIBC: 349 ug/dL (ref 250–450)
UIBC: 275 ug/dL (ref 148–442)

## 2023-12-26 NOTE — Progress Notes (Signed)
 Hematology and Oncology Follow Up Visit  Christy Kelly [id number] 09-Aug-1950 73 y.o. 12/26/2023   Principle Diagnosis:  Iron  deficiency anemia-patient donating blood.  Current Therapy:   Feraheme 510 mg IV x 2 doses.  First dose given on 11/14/2023     Interim History:  Christy Kelly is back for a follow-up.  She feels so much better.  The iron  that we gave her clearly helped her.  Hemoglobin is now up to 14.7.Aaron Aas  She is able to have a great time out in [place] .  She and her husband went out to [place] .  Headache fantastic time.  She had a lot of energy after.  Her problem right now is that she is having some back pain.  She had an MRI of the back.  She is going to have a doctor's appointment to get the report I think later on this week.  Again, she feels so much better after having the iron .  When we saw her, her ferritin was down to 6.  I told her that we will see if she can donate blood again.  This will really be dependent on her ferritin.  Otherwise, she feels well.  She has had no fever.  She has had no change in bowel or bladder habits.  She is eating well.  She has had no leg swelling.  There is been no rashes.  Overall, I would say her performance status is probably ECOG 0.    Medications:  Current Outpatient Medications:    albuterol  (VENTOLIN  HFA) 108 (90 Base) MCG/ACT inhaler, Inhale 2 puffs into the lungs every 6 (six) hours as needed for wheezing or shortness of breath. (Patient not taking: Reported on 10/15/2022), Disp: 8 g, Rfl: 2   Alpha-Lipoic Acid 200 MG CAPS, Take by mouth., Disp: , Rfl:    calcium carbonate (OS-CAL) 600 MG TABS tablet, Take 600 mg by mouth 2 (two) times daily with a meal., Disp: , Rfl:    Cetirizine  HCl (ZYRTEC  PO), Take by mouth., Disp: , Rfl:    Cholecalciferol (VITAMIN D ) 50 MCG (2000 UT) CAPS, Take 1 capsule by mouth daily., Disp: , Rfl:    EPINEPHrine  0.3 mg/0.3 mL IJ SOAJ injection, Inject 0.3 mg into the muscle as needed for anaphylaxis.,  Disp: 1 each, Rfl: 11   MAGNESIUM PO, Take 500 mg by mouth daily. , Disp: , Rfl:    melatonin 5 MG TABS, Take 5 mg by mouth at bedtime as needed., Disp: , Rfl:    Omega-3 Fatty Acids (FISH OIL) 1000 MG CAPS, Take 1,000 mg by mouth daily., Disp: , Rfl:    vitamin B-12 (CYANOCOBALAMIN ) 250 MCG tablet, Take 250 mcg by mouth daily., Disp: , Rfl:   Allergies:  Allergies  Allergen Reactions   Codeine Itching, Nausea And Vomiting, Hives and Nausea Only   Hydrocodone Nausea Only    Other reaction(s): nausea    Past Medical History, Surgical history, Social history, and Family History were reviewed and updated.  Review of Systems: Review of Systems  Constitutional:  Positive for fatigue.  HENT:  Negative.    Eyes: Negative.   Respiratory: Negative.    Cardiovascular: Negative.   Gastrointestinal: Negative.   Endocrine: Negative.   Genitourinary: Negative.    Musculoskeletal: Negative.   Skin: Negative.   Neurological: Negative.   Hematological: Negative.   Psychiatric/Behavioral: Negative.      Physical Exam:  height is 5' 8.5" (1.74 m) and weight is 161 lb (73 kg).  Her oral temperature is 98.3 F (36.8 C). Her blood pressure is 126/75 and her pulse is 89. Her respiration is 18 and oxygen saturation is 98%.   Wt Readings from Last 3 Encounters:  12/26/23 161 lb (73 kg)  11/14/23 157 lb (71.2 kg)  11/08/23 157 lb 3.2 oz (71.3 kg)    Physical Exam Vitals reviewed.  HENT:     Head: Normocephalic and atraumatic.  Eyes:     Pupils: Pupils are equal, round, and reactive to light.  Cardiovascular:     Rate and Rhythm: Normal rate and regular rhythm.     Heart sounds: Normal heart sounds.  Pulmonary:     Effort: Pulmonary effort is normal.     Breath sounds: Normal breath sounds.  Abdominal:     General: Bowel sounds are normal.     Palpations: Abdomen is soft.  Musculoskeletal:        General: No tenderness or deformity. Normal range of motion.     Cervical back: Normal  range of motion.  Lymphadenopathy:     Cervical: No cervical adenopathy.  Skin:    General: Skin is warm and dry.     Findings: No erythema or rash.  Neurological:     Mental Status: She is alert and oriented to person, place, and time.  Psychiatric:        Behavior: Behavior normal.        Thought Content: Thought content normal.        Judgment: Judgment normal.      Lab Results  Component Value Date   WBC 8.6 12/26/2023   HGB 14.7 12/26/2023   HCT 43.5 12/26/2023   MCV 88.8 12/26/2023   PLT 206 12/26/2023     Chemistry      Component Value Date/Time   NA 140 12/26/2023 0955   NA 142 11/06/2023 0938   K 4.2 12/26/2023 0955   CL 103 12/26/2023 0955   CO2 29 12/26/2023 0955   BUN 12 12/26/2023 0955   BUN 14 11/06/2023 0938   CREATININE 1.08 (H) 12/26/2023 0955   CREATININE 1.02 04/28/2021 1402   GLU 76 03/10/2021 0000      Component Value Date/Time   CALCIUM 9.8 12/26/2023 0955   ALKPHOS 91 12/26/2023 0955   AST 25 12/26/2023 0955   ALT 23 12/26/2023 0955   BILITOT 0.6 12/26/2023 0955      Impression and Plan: Christy Kelly is a very nice 73 year old white female.  She has had a history of iron  deficiency in the past.  When we saw her in April, she was iron  deficient again.  She had profound iron  deficiency.  We gave her the IV iron .  This worked incredibly well.  I think that she will be able to donate blood again.  However, she will have to be very conservative with blood donations.  I would think that she probably could donate blood may be twice a year.  Again we will see what the ferritin is.  At this point, I do not think we have to see her back unless she has any problems.  She will certainly let us  know if she needs to have any lab work done.   Ivor Mars, MD 5/22/202511:03 AM

## 2024-01-03 ENCOUNTER — Ambulatory Visit: Admitting: Physical Therapy

## 2024-01-07 ENCOUNTER — Ambulatory Visit: Attending: Student | Admitting: Physical Therapy

## 2024-01-07 ENCOUNTER — Other Ambulatory Visit: Payer: Self-pay

## 2024-01-07 ENCOUNTER — Encounter: Payer: Self-pay | Admitting: Physical Therapy

## 2024-01-07 DIAGNOSIS — M5459 Other low back pain: Secondary | ICD-10-CM | POA: Insufficient documentation

## 2024-01-07 NOTE — Therapy (Signed)
 OUTPATIENT PHYSICAL THERAPY THORACOLUMBAR EVALUATION   Patient Name: Christy Kelly MRN: [id number] DOB:[birth year], 73 y.o., female Today's Date: 01/07/2024  END OF SESSION:  PT End of Session - 01/07/24 1228     Visit Number 1    Number of Visits 12    Date for PT Re-Evaluation 02/18/24    PT Start Time 0940    PT Stop Time 1036    PT Time Calculation (min) 56 min    Activity Tolerance Patient tolerated treatment well    Behavior During Therapy [facility] for tasks assessed/performed             Past Medical History:  Diagnosis Date   Allergy    Chicken pox    Goals of care, counseling/discussion 05/19/2021   Hyperlipidemia    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   Past Surgical History:  Procedure Laterality Date   BUNIONECTOMY     x2 about 2012 bilateral.    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  REFERRING DIAG: Radiculopathy,  lumbar region.  Rationale for Evaluation and Treatment: Rehabilitation  THERAPY DIAG:  Other low back pain  ONSET DATE: 10 years.    SUBJECTIVE:                                                                                                                                                                                           SUBJECTIVE STATEMENT: The patient presents to the clinic with c/o chronic low back pain that has worsened over the years.  She tried a year of Chiropractic care and recently  had two sessions of dry needling (which was painful) with essentially no benefit. She was provided with multiple home exercises. She has pain that radiates into her right buttock.  Her resting pain-level is a 2-3/10 today but can rise to much higher levels with increased activity, bending and prolonged standing.  Heat and Ibuprofen  help decrease pain.  She describes her pain as throbbing, sharp, numb and shooting.  She is scheduled for an ESI.    PERTINENT HISTORY:  Lumbar DDD.    PAIN:  Are you having pain? Yes: NPRS scale: 2-3/10 Pain location: Low back, right buttock.   Pain description: As above. Aggravating factors: As above. Relieving factors: As above.  PRECAUTIONS: None  RED FLAGS: None   WEIGHT BEARING RESTRICTIONS: No  FALLS:  Has patient fallen in last 6 months? No  LIVING ENVIRONMENT: Lives with: lives with their spouse Lives in: House/apartment   PLOF: Independent    OBJECTIVE:   PATIENT SURVEYS:  Modified Oswestry 16/50.    POSTURE: No Significant postural limitations  PALPATION: Patient c/o diffuse bilateral low back pain and in region of right Sciatic notch.    LUMBAR ROM:   Full active lumbar flexion and extension.  LOWER EXTREMITY ROM:     WNL.  LOWER EXTREMITY MMT:    Normal bilateral LE strength.  LUMBAR SPECIAL TESTS:  Equal leg lengths. (-) SLR, FABER and Sacral Press test.  Normal LE DTR's.   GAIT: WNL.   TREATMENT DATE:  01/07/24:    HMP and IFC at 80-150 Hz on 40% scan x 20 minutes to patient's bilateral lower lumbar region.  Normal modality response following removal of modality.                                                                                                                               PATIENT EDUCATION:  Education details: Right Piriformis stretch. Person educated: Patient Education method: Medical illustrator Education comprehension: verbalized understanding and returned demonstration  HOME EXERCISE PROGRAM: As above.    ASSESSMENT:  CLINICAL IMPRESSION: The patient presents to [facility] with chronic low back pain and radiation into her right buttock.  She exhibits normal active lumbar flexion and extension and normal LE strength.  Her LE DTR's are normal and special testing is negative.  The patient c/o diffuse bilateral low back pain and in region of right Sciatic notch.  Her pain prohibits her from living an active lifestyle.  Her Modified Owestry score is 16/50.  Patient will benefit from skilled physical therapy intervention to address pain and deficits.  OBJECTIVE IMPAIRMENTS: decreased activity tolerance and pain.   ACTIVITY LIMITATIONS: carrying, lifting, bending, and standing  PARTICIPATION LIMITATIONS: meal prep, cleaning, laundry, community activity, and yard work  PERSONAL FACTORS: Time since onset of injury/illness/exacerbation are also affecting patient's functional outcome.   REHAB POTENTIAL: Good minus.  CLINICAL DECISION MAKING: Evolving/moderate complexity  EVALUATION COMPLEXITY: Moderate   GOALS:  SHORT TERM GOALS: Target  date: 01/21/24.  Ind with an initial HEP. Goal status: INITIAL  LONG TERM GOALS: Target date: 02/18/24  Ind with a HEP.  Goal status: INITIAL  2.  Perform ADL's with pain not > 3/10.  Goal status: INITIAL  3.  Eliminate right buttock pain.  Goal status: INITIAL   PLAN:  PT FREQUENCY: 2x/week  PT DURATION: 6  weeks  PLANNED INTERVENTIONS: 97110-Therapeutic exercises, 97530- Therapeutic activity, [id number]- Neuromuscular re-education, 97535- Self Care, 16109- Manual therapy, G0283- Electrical stimulation (unattended), 97016- Vasopneumatic device, [id number]- Ultrasound, Patient/Family education, Cryotherapy, and Moist heat.  PLAN FOR NEXT SESSION: Core exercise progression.  Spinal protection technique techniques.  Modalities and STW/M as needed.     Keisy Strickler, [place], PT 01/07/2024, 12:58 PM

## 2024-01-08 DIAGNOSIS — M5416 Radiculopathy, lumbar region: Secondary | ICD-10-CM | POA: Diagnosis not present

## 2024-01-09 ENCOUNTER — Encounter: Admitting: Family Medicine

## 2024-01-13 ENCOUNTER — Encounter: Payer: Self-pay | Admitting: *Deleted

## 2024-01-13 ENCOUNTER — Ambulatory Visit: Admitting: *Deleted

## 2024-01-13 DIAGNOSIS — M5459 Other low back pain: Secondary | ICD-10-CM

## 2024-01-13 NOTE — Therapy (Signed)
 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   Past Surgical History:  Procedure Laterality Date   BUNIONECTOMY     x2 about 2012 bilateral.    COLONOSCOPY  03/29/2021   Attending MD: Genell Ken, MD; Location: [facility]   HYSTEROSCOPY  2001   TONSILLECTOMY  1962   UPPER GI ENDOSCOPY  03/29/2021   Attending MD: Genell Ken, MD; Location: [facility]   WISDOM TOOTH EXTRACTION     Patient Active Problem List   Diagnosis Date Noted   Restless leg 01/17/2022   Lumbar radiculopathy 01/17/2022   Venous insufficiency 01/17/2022   Diverticulosis of duodenum 10/13/2021   Diverticular disease of colon 10/13/2021   Chronic superficial gastritis without bleeding 10/13/2021   Antral ulcer 10/13/2021   Acid reflux 10/13/2021   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  REFERRING DIAG: Radiculopathy, lumbar region.  Rationale for Evaluation and Treatment: Rehabilitation  THERAPY DIAG:  Other low back  pain  ONSET DATE: 10 years.    SUBJECTIVE:                                                                                                                                                                                           SUBJECTIVE STATEMENT: The patient presents to the clinic with c/o chronic low back pain that has worsened over the years.  Reports spondylolisthesis  From xray/ MRI??  Pain standing   RT side pain. Sitting is better  PERTINENT HISTORY:  Lumbar DDD.    PAIN:  Are you having pain? Yes: NPRS scale: 2-3/10 Pain location: Low back, right buttock.   Pain description: As above. Aggravating factors: As above. Relieving factors: As above.  PRECAUTIONS: None  RED FLAGS: None   WEIGHT BEARING RESTRICTIONS: No  FALLS:  Has patient fallen in last 6 months? No  LIVING ENVIRONMENT: Lives with: lives with their spouse Lives in: House/apartment   PLOF: Independent    OBJECTIVE:   PATIENT SURVEYS:  Modified Oswestry 16/50.    POSTURE: No Significant postural limitations  PALPATION: Patient c/o diffuse bilateral low back pain and in region of right Sciatic notch.    LUMBAR ROM:   Full active lumbar flexion and extension.  LOWER EXTREMITY ROM:     WNL.  LOWER EXTREMITY MMT:    Normal bilateral LE strength.  LUMBAR SPECIAL TESTS:  Equal leg lengths. (-) SLR, FABER and Sacral Press test.  Normal LE DTR's.   GAIT: WNL.   TREATMENT DATE: 01/13/24:    Discussed AB bracing with transitional movements as well as modifying movement patterns with ADL's to decrease pain triggers throughout the day. Reviwed HEP with added SOC for spinal control and finding neutral position    HMP and IFC     PATIENT EDUCATION:  Education details: Right Piriformis stretch. Person educated: Patient Education method: Medical illustrator Education comprehension: verbalized understanding and returned demonstration  HOME EXERCISE PROGRAM: As above.     ASSESSMENT:  CLINICAL IMPRESSION: The patient presents to [facility] with chronic low back pain and radiation into her right buttock. Rx focused on  Discussion  and demonstration of AB bracing with transitional movements as well as modifying movement patterns with ADL's to decrease pain triggers throughout the day. HEP was also reviewed with added SOC pelvic tilts for spinal control/ mobility and neutral posture awareness. Pt did well with  good understanding on concepts.   OBJECTIVE IMPAIRMENTS: decreased activity tolerance and pain.   ACTIVITY LIMITATIONS: carrying, lifting, bending, and standing  PARTICIPATION LIMITATIONS: meal prep, cleaning, laundry, community activity, and yard work  PERSONAL FACTORS: Time since onset of injury/illness/exacerbation are also affecting patient's functional outcome.   REHAB POTENTIAL: Good minus.  CLINICAL DECISION MAKING: Evolving/moderate complexity  EVALUATION COMPLEXITY: Moderate   GOALS:  SHORT TERM GOALS: Target date: 01/21/24.  Ind with an initial HEP. Goal status: INITIAL  LONG TERM GOALS: Target date: 02/18/24  Ind with a HEP.  Goal status: INITIAL  2.  Perform ADL's with pain not > 3/10.  Goal status: INITIAL  3.  Eliminate right buttock pain.  Goal status: INITIAL   PLAN:  PT FREQUENCY: 2x/week  PT DURATION: 6 weeks  PLANNED INTERVENTIONS: 97110-Therapeutic exercises, 97530- Therapeutic activity, [id number]- Neuromuscular re-education, 97535- Self Care, 16109- Manual therapy, G0283- Electrical stimulation (unattended), 97016- Vasopneumatic device, [id number]- Ultrasound, Patient/Family education, Cryotherapy, and Moist heat.  PLAN FOR NEXT SESSION: Core exercise progression.  Spinal protection technique techniques.  Modalities and STW/M as needed.     Collyns Mcquigg,CHRIS, PTA 01/13/2024, 5:25 PM

## 2024-01-14 ENCOUNTER — Encounter: Admitting: Physical Therapy

## 2024-01-20 ENCOUNTER — Ambulatory Visit: Admitting: Physical Therapy

## 2024-01-20 DIAGNOSIS — M5459 Other low back pain: Secondary | ICD-10-CM | POA: Diagnosis not present

## 2024-01-20 NOTE — Therapy (Signed)
 OUTPATIENT PHYSICAL THERAPY THORACOLUMBAR TREATMENT   Patient Name: Christy Kelly MRN: [id number] DOB:07/16/1951, 73 y.o., female Today's Date: 01/20/2024  END OF SESSION:  PT End of Session - 01/20/24 1135     Visit Number 3    Number of Visits 12    Date for PT Re-Evaluation 02/18/24    PT Start Time 1017    PT Stop Time 1111    PT Time Calculation (min) 54 min    Activity Tolerance Patient tolerated treatment well    Behavior During Therapy [facility] for tasks assessed/performed          Past Medical History:  Diagnosis Date   Allergy    Chicken pox    Goals of care, counseling/discussion 05/19/2021   Hyperlipidemia    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   Past Surgical History:  Procedure Laterality Date   BUNIONECTOMY     x2 about 2012 bilateral.    COLONOSCOPY  03/29/2021   Attending MD: Genell Ken, MD; Location: [facility]   HYSTEROSCOPY  2001   TONSILLECTOMY  1962   UPPER GI ENDOSCOPY  03/29/2021   Attending MD: Genell Ken, MD; Location: [facility]   WISDOM TOOTH EXTRACTION     Patient Active Problem List   Diagnosis Date Noted   Restless leg 01/17/2022   Lumbar radiculopathy 01/17/2022   Venous insufficiency 01/17/2022   Diverticulosis of duodenum 10/13/2021   Diverticular disease of colon 10/13/2021   Chronic superficial gastritis without bleeding 10/13/2021   Antral ulcer 10/13/2021   Acid reflux 10/13/2021   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  REFERRING DIAG: Radiculopathy,  lumbar region.  Rationale for Evaluation and Treatment: Rehabilitation  THERAPY DIAG:  Other low back pain  ONSET DATE: 10 years.    SUBJECTIVE:                                                                                                                                                                                           SUBJECTIVE STATEMENT: Feel like I'm worse and the pain is on both sides today.  When the pain hits me it's an 8. PERTINENT HISTORY:  Lumbar DDD.    PAIN:  Are you having pain? Yes: NPRS scale: 8/10 Pain location: Low back, right buttock.   Pain description: As above. Aggravating factors: As above. Relieving factors: As above.  PRECAUTIONS: None  RED FLAGS: None   WEIGHT BEARING RESTRICTIONS: No  FALLS:  Has patient fallen in last 6 months? No  LIVING ENVIRONMENT: Lives with: lives with their spouse Lives in: House/apartment   PLOF: Independent    OBJECTIVE:   PATIENT SURVEYS:  Modified Oswestry 16/50.    POSTURE: No Significant postural limitations  PALPATION: Patient c/o diffuse bilateral low back pain and in region of right Sciatic notch.    LUMBAR ROM:   Full active lumbar flexion and extension.  LOWER EXTREMITY ROM:     WNL.  LOWER EXTREMITY MMT:    Normal bilateral LE strength.  LUMBAR SPECIAL TESTS:  Equal leg lengths. (-) SLR, FABER and Sacral Press test.  Normal LE DTR's.   GAIT: WNL.   TREATMENT DATE:    01/20/24:  Left sdly position with folded pillow between knees for comfort:  Combo e'stim/US  at 1.50 W/CM2 x 12 minutes to patient's right lumbar and upper glut musculature f/b STW/M x 12 minutes f/b IFC at 80-150 Hz on 40% scan x 20 minutes.  Normal modality response following removal of modality.     PATIENT EDUCATION:  Education details: Right Piriformis stretch. Person educated: Patient Education method: Medical illustrator Education comprehension: verbalized understanding and returned  demonstration  HOME EXERCISE PROGRAM: As above.    ASSESSMENT:  CLINICAL IMPRESSION: Patient presented to the clinic with a high pain-level and pain on both sides of her lumbar spine.  Focused on right side with a very good response and feeling much better.  Patient is going to see how she feels and then call clinic for another appointment if she feels it is helping.     OBJECTIVE IMPAIRMENTS: decreased activity tolerance and pain.   ACTIVITY LIMITATIONS: carrying, lifting, bending, and standing  PARTICIPATION LIMITATIONS: meal prep, cleaning, laundry, community activity, and yard work  PERSONAL FACTORS: Time since onset of injury/illness/exacerbation are also affecting patient's functional outcome.   REHAB POTENTIAL: Good minus.  CLINICAL DECISION MAKING: Evolving/moderate complexity  EVALUATION COMPLEXITY: Moderate   GOALS:  SHORT TERM GOALS: Target date: 01/21/24.  Ind with an initial HEP. Goal status: INITIAL  LONG TERM GOALS: Target date: 02/18/24  Ind with a HEP.  Goal status: INITIAL  2.  Perform ADL's with pain not > 3/10.  Goal status: INITIAL  3.  Eliminate right buttock pain.  Goal status: INITIAL   PLAN:  PT FREQUENCY: 2x/week  PT DURATION: 6 weeks  PLANNED INTERVENTIONS: 97110-Therapeutic exercises, 97530- Therapeutic activity, [id number]- Neuromuscular re-education, 97535- Self Care, 41324- Manual therapy, G0283- Electrical stimulation (unattended), 97016- Vasopneumatic device, [id number]- Ultrasound, Patient/Family education, Cryotherapy, and Moist heat.  PLAN FOR NEXT SESSION: Core exercise progression.  Spinal protection technique techniques.  Modalities and STW/M as needed.     Jadis Pitter, [place], PT 01/20/2024, 12:10 PM

## 2024-01-22 ENCOUNTER — Encounter: Admitting: Physical Therapy

## 2024-02-04 DIAGNOSIS — M5416 Radiculopathy, lumbar region: Secondary | ICD-10-CM | POA: Diagnosis not present

## 2024-02-13 ENCOUNTER — Telehealth: Payer: Self-pay | Admitting: Family Medicine

## 2024-02-13 NOTE — Telephone Encounter (Signed)
 Please Review

## 2024-02-13 NOTE — Telephone Encounter (Unsigned)
 Copied from CRM [id number]. Topic: Referral - Request for Referral >> Feb 13, 2024 12:22 PM Tobias L wrote: Did the patient discuss referral with their provider in the last year? Yes  Appointment offered? No  Type of order/referral and detailed reason for visit: Physical Therapy - Back Patient requesting referral for a new physical therapist. Patient states she was approved for 12 physical therapy visits. Requesting for demographics and last OV note to be sent to office below.   Preference of office, provider, location: [facility]  [address] [place], [place] Ph: [phone] / [phone]  If referral order, have you been seen by this specialty before? Yes Same issue, [facility] in [place], they are unable to continue seeing and treating patient as they do not think the treatment is helping.  Can we respond through MyChart? No

## 2024-02-14 ENCOUNTER — Other Ambulatory Visit: Payer: Self-pay | Admitting: Family Medicine

## 2024-02-14 DIAGNOSIS — M5136 Other intervertebral disc degeneration, lumbar region with discogenic back pain only: Secondary | ICD-10-CM

## 2024-02-14 DIAGNOSIS — M5416 Radiculopathy, lumbar region: Secondary | ICD-10-CM

## 2024-02-14 NOTE — Telephone Encounter (Signed)
 I do not have clearance to place a referral  :(

## 2024-03-19 DIAGNOSIS — R269 Unspecified abnormalities of gait and mobility: Secondary | ICD-10-CM | POA: Diagnosis not present

## 2024-03-19 DIAGNOSIS — M5136 Other intervertebral disc degeneration, lumbar region with discogenic back pain only: Secondary | ICD-10-CM | POA: Diagnosis not present

## 2024-03-31 DIAGNOSIS — M5136 Other intervertebral disc degeneration, lumbar region with discogenic back pain only: Secondary | ICD-10-CM | POA: Diagnosis not present

## 2024-03-31 DIAGNOSIS — R269 Unspecified abnormalities of gait and mobility: Secondary | ICD-10-CM | POA: Diagnosis not present

## 2024-04-14 DIAGNOSIS — R269 Unspecified abnormalities of gait and mobility: Secondary | ICD-10-CM | POA: Diagnosis not present

## 2024-04-14 DIAGNOSIS — M5136 Other intervertebral disc degeneration, lumbar region with discogenic back pain only: Secondary | ICD-10-CM | POA: Diagnosis not present

## 2024-04-21 DIAGNOSIS — M5136 Other intervertebral disc degeneration, lumbar region with discogenic back pain only: Secondary | ICD-10-CM | POA: Diagnosis not present

## 2024-04-21 DIAGNOSIS — R269 Unspecified abnormalities of gait and mobility: Secondary | ICD-10-CM | POA: Diagnosis not present

## 2024-05-01 DIAGNOSIS — R269 Unspecified abnormalities of gait and mobility: Secondary | ICD-10-CM | POA: Diagnosis not present

## 2024-05-01 DIAGNOSIS — M5136 Other intervertebral disc degeneration, lumbar region with discogenic back pain only: Secondary | ICD-10-CM | POA: Diagnosis not present

## 2024-05-13 DIAGNOSIS — R269 Unspecified abnormalities of gait and mobility: Secondary | ICD-10-CM | POA: Diagnosis not present

## 2024-05-13 DIAGNOSIS — M5136 Other intervertebral disc degeneration, lumbar region with discogenic back pain only: Secondary | ICD-10-CM | POA: Diagnosis not present

## 2024-05-28 DIAGNOSIS — M5136 Other intervertebral disc degeneration, lumbar region with discogenic back pain only: Secondary | ICD-10-CM | POA: Diagnosis not present

## 2024-05-28 DIAGNOSIS — R269 Unspecified abnormalities of gait and mobility: Secondary | ICD-10-CM | POA: Diagnosis not present

## 2024-07-09 ENCOUNTER — Emergency Department (HOSPITAL_BASED_OUTPATIENT_CLINIC_OR_DEPARTMENT_OTHER)

## 2024-07-09 ENCOUNTER — Other Ambulatory Visit: Payer: Self-pay

## 2024-07-09 ENCOUNTER — Encounter (HOSPITAL_BASED_OUTPATIENT_CLINIC_OR_DEPARTMENT_OTHER): Payer: Self-pay | Admitting: Emergency Medicine

## 2024-07-09 ENCOUNTER — Emergency Department (HOSPITAL_BASED_OUTPATIENT_CLINIC_OR_DEPARTMENT_OTHER)
Admission: EM | Admit: 2024-07-09 | Discharge: 2024-07-09 | Disposition: A | Discharge status: Home / Self Care | Attending: Emergency Medicine | Admitting: Emergency Medicine

## 2024-07-09 DIAGNOSIS — R0789 Other chest pain: Secondary | ICD-10-CM | POA: Insufficient documentation

## 2024-07-09 DIAGNOSIS — R079 Chest pain, unspecified: Secondary | ICD-10-CM

## 2024-07-09 LAB — BASIC METABOLIC PANEL WITH GFR
Anion gap: 13 (ref 5–15)
BUN: 14 mg/dL (ref 8–23)
CO2: 23 mmol/L (ref 22–32)
Calcium: 9.4 mg/dL (ref 8.9–10.3)
Chloride: 104 mmol/L (ref 98–111)
Creatinine, Ser: 1.08 mg/dL — ABNORMAL HIGH (ref 0.44–1.00)
GFR, Estimated: 55 mL/min — ABNORMAL LOW (ref >60)
Glucose, Bld: 86 mg/dL (ref 70–99)
Potassium: 3.5 mmol/L (ref 3.5–5.1)
Sodium: 141 mmol/L (ref 135–145)

## 2024-07-09 LAB — CBC
HCT: 44.3 % (ref 36.0–46.0)
Hemoglobin: 14.9 g/dL (ref 12.0–15.0)
MCH: 30.9 pg (ref 26.0–34.0)
MCHC: 33.6 g/dL (ref 30.0–36.0)
MCV: 91.9 fL (ref 80.0–100.0)
Platelets: 186 K/uL (ref 150–400)
RBC: 4.82 MIL/uL (ref 3.87–5.11)
RDW: 12.7 % (ref 11.5–15.5)
WBC: 6.6 K/uL (ref 4.0–10.5)
nRBC: 0 % (ref 0.0–0.2)

## 2024-07-09 LAB — TROPONIN T, HIGH SENSITIVITY
Troponin T High Sensitivity: <15 ng/L (ref 0–19)
Troponin T High Sensitivity: <15 ng/L (ref 0–19)

## 2024-07-09 MED ORDER — IOHEXOL 350 MG/ML SOLN
75.0000 mL | Freq: Once | INTRAVENOUS | Status: AC | PRN
  Administered 2024-07-09: 75 mL via INTRAVENOUS

## 2024-07-09 MED ORDER — SODIUM CHLORIDE 0.9 % IV BOLUS
1000.0000 mL | Freq: Once | INTRAVENOUS | Status: AC
  Administered 2024-07-09: 1000 mL via INTRAVENOUS

## 2024-07-09 NOTE — ED Notes (Signed)
 Report received from [place], [place]. Assuming pt care at this time.

## 2024-07-09 NOTE — ED Notes (Signed)
 Reviewed discharge instructions and follow up with pt. Pt states understanding. Ambulatory with husband at discharge. Stable WNL

## 2024-07-09 NOTE — ED Triage Notes (Signed)
 Pt c/o chest pain and pressure, radiating to L arm and L neck, shob,  x 2 days. Pain worse at night, reports pain improved today. Denies n/v/diaphoresis.   Recent endo/colonoscopy 2 weeks- prescribed flagyl for H pylori. Taking as prescribed.

## 2024-07-09 NOTE — Discharge Instructions (Addendum)
 It was a pleasure caring for you today in the emergency department.  Return to the Emergency Department if you have unusual chest pain, pressure, or discomfort, shortness of breath, nausea, vomiting, burping, heartburn, tingling upper body parts, sweating, cold, clammy skin, or racing heartbeat. Call 911 if you think you are having a heart attack. Take all medications as prescribed - notify your doctor if you have any side effects. Follow cardiac diet - avoid fatty & fried foods, don't eat too much red meat, eat lots of fruits & vegetables, and dairy products should be low fat. Please lose weight if you are overweight. Become more active with walking, gardening, or any other activity that gets you to moving.   Be sure to take your antibiotics with food.  Please return to the emergency department immediately for any new or concerning symptoms, or if you get worse.

## 2024-07-09 NOTE — ED Provider Notes (Signed)
 [place] [facility] Provider Note  CSN: [id number] Arrival date & time: 07/09/24 1102  Chief Complaint(s) Chest Pain  HPI Christy Kelly is a 73 y.o. female with past medical history as below, significant for hyperlipidemia, scoliosis, lumbar radiculopathy, PUD who presents to the ED with complaint of chest pain  Patient reports intermittent chest pain since Tuesday evening.  Initially began at rest, tightness, squeezing sensation, left side chest wall, radiation to her left shoulder and left jaw.  Lasted throughout the evening and eventually abated in the morning.  She had another episode yesterday afternoon that was similar in character, squeezing, cramping sensation to chest wall with radiation to left jaw and left shoulder.  No associated nausea, vomiting, diaphoresis, syncope or dyspnea.  Symptoms have since subsided.  Symptoms were worsened with position changes or bending over.  Denies similar symptoms in the past.  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 Authorizing Provider  albuterol  (VENTOLIN  HFA) 108 (90 Base) MCG/ACT inhaler Inhale 2 puffs into the lungs every 6 (six) hours as needed for wheezing or shortness of breath. Patient not taking: Reported on 10/15/2022 03/30/22   Severa Rock HERO, FNP  Alpha-Lipoic Acid 200 MG CAPS Take by mouth.    [provider]  calcium carbonate (OS-CAL) 600 MG TABS tablet Take 600 mg by mouth 2 (two) times daily with a meal.    [provider]  Cetirizine  HCl (ZYRTEC  PO) Take by mouth.    [provider]  Cholecalciferol (VITAMIN D ) 50 MCG (2000 UT) CAPS Take 1 capsule by mouth daily.    [provider]  EPINEPHrine  0.3 mg/0.3 mL IJ SOAJ injection Inject 0.3 mg into the muscle as needed for anaphylaxis. 05/08/22   Severa Rock HERO, FNP  MAGNESIUM PO Take 500 mg by mouth daily.     [provider]  melatonin 5 MG TABS Take 5 mg by mouth at bedtime as needed.    [provider]  Meloxicam (MOBIC PO) Take by mouth.    [provider]  Omega-3 Fatty Acids (FISH OIL) 1000 MG CAPS Take 1,000 mg by mouth daily.    [provider]  vitamin B-12 (CYANOCOBALAMIN ) 250 MCG tablet Take 250 mcg by mouth daily.    [provider]  Past Surgical History Past Surgical History:  Procedure Laterality Date   BUNIONECTOMY     x2 about 2012 bilateral.    COLONOSCOPY  03/29/2021   Attending MD: Estelita Manas, MD; Location: [facility]   HYSTEROSCOPY  2001   TONSILLECTOMY  1962   UPPER GI ENDOSCOPY  03/29/2021   Attending MD:  Estelita Manas, MD; Location: [facility]   WISDOM TOOTH EXTRACTION     Family History Family History  Problem Relation Age of Onset   Dementia Mother    Stroke Father    Early death Father    Hypertension Brother    Hypercholesterolemia Brother    Alcohol abuse Brother    Breast cancer Paternal Grandmother    Cancer Paternal Grandfather        Uncertain type- Li fraumeni gene   Colon cancer Neg Hx    Esophageal cancer Neg Hx    Rectal cancer Neg Hx    Stomach cancer Neg Hx     Social History Social History   Tobacco Use   Smoking status: Never   Smokeless tobacco: Never  Vaping Use   Vaping status: Never Used  Substance Use Topics   Alcohol use: No   Drug use: No   Allergies Codeine and Hydrocodone  Review of Systems A thorough review of systems was obtained and all systems are negative except as noted in the HPI and PMH.   Physical Exam Vital Signs  I have reviewed the triage vital signs BP 106/69   Pulse 83   Temp 97.6 F (36.4 C) (Oral)   Resp 19   Ht 5' 8.5 (1.74 m)   Wt 71.7 kg   SpO2 97%   BMI 23.67 kg/m  Physical Exam Vitals and nursing note reviewed.  Constitutional:      General: She is not in acute distress.    Appearance: Normal appearance.  HENT:     Head: Normocephalic and atraumatic.     Right Ear: External ear normal.     Left Ear: External ear normal.     Nose: Nose normal.     Mouth/Throat:     Mouth: Mucous membranes are moist.  Eyes:     General: No scleral icterus.       Right eye: No discharge.        Left eye: No discharge.  Cardiovascular:     Rate and Rhythm: Normal rate and regular rhythm.     Pulses: Normal pulses.     Heart sounds: Normal heart sounds.  Pulmonary:     Effort: Pulmonary effort is normal. No respiratory distress.     Breath sounds: Normal breath sounds. No stridor.  Abdominal:     General: Abdomen is flat. There is no distension.     Palpations: Abdomen is soft.     Tenderness: There is no  abdominal tenderness.  Musculoskeletal:     Cervical back: No rigidity.     Right lower leg: No edema.     Left lower leg: No edema.  Skin:    General: Skin is warm and dry.     Capillary Refill: Capillary refill takes less than 2 seconds.  Neurological:     Mental Status: She is alert.  Psychiatric:        Mood and Affect: Mood normal.        Behavior: Behavior normal. Behavior is cooperative.     ED Results and Treatments Labs (all labs ordered are listed, but only abnormal  results are displayed) Labs Reviewed  BASIC METABOLIC PANEL WITH GFR - Abnormal; Notable for the following components:      Result Value   Creatinine, Ser 1.08 (*)    GFR, Estimated 55 (*)    All other components within normal limits  CBC  TROPONIN T, HIGH SENSITIVITY  TROPONIN T, HIGH SENSITIVITY                                                                                                                          Radiology No results found.   Pertinent labs & imaging results that were available during my care of the patient were reviewed by me and considered in my medical decision making (see MDM for details).  Medications Ordered in ED Medications  sodium chloride  0.9 % bolus 1,000 mL (0 mLs Intravenous Stopped 07/09/24 1425)  iohexol  (OMNIPAQUE ) 350 MG/ML injection 75 mL (75 mLs Intravenous Contrast Given 07/09/24 1318)                                                                                                                                     Procedures Procedures  (including critical care time)  Medical Decision Making / ED Course    Medical Decision Making:    TAYNA SMETHURST is a 72 y.o. female with past medical history as below, significant for hyperlipidemia, scoliosis, lumbar radiculopathy, PUD who presents to the ED with complaint of chest pain. The complaint involves an extensive differential diagnosis and also carries with it a high risk of complications and morbidity.   Serious etiology was considered. Ddx includes but is not limited to: Differential includes all life-threatening causes for chest pain. This includes but is not exclusive to acute coronary syndrome, aortic dissection, pulmonary embolism, cardiac tamponade, community-acquired pneumonia, pericarditis, musculoskeletal chest wall pain, etc.   Complete initial physical exam performed, notably the patient was in no acute distress, resting comfortably, pain-free.    Reviewed and confirmed nursing documentation for past medical history, family history, social history.  Vital signs reviewed.    Chest pain > - Left-sided chest pain, pressure, cramping, heaviness sensation with radiation to left shoulder and left neck.  Has since subsided.  Most recent episode was yesterday afternoon - EKG not ischemic - Troponin x 2 negative - CXR stable - currently pain free - CTA stable - Labs are stable - Ambulatory referral to cardiology -  Discussed taking her doxycycline  with a meal and at least 1 hour prior to bedtime  Clinical Course as of 07/10/24 1634  Thu Jul 09, 2024  1233 Echo stress test 5/18, low risk [SG]  1531 Remains pain free [SG]    Clinical Course User Index [SG] Elnor Jayson LABOR, DO     The patient's chest pain is not suggestive of pulmonary embolus, cardiac ischemia, aortic dissection, pericarditis, myocarditis, pulmonary embolism, pneumothorax, pneumonia, Zoster, or esophageal perforation, or other serious etiology.  Historically not abrupt in onset, tearing or ripping, pulses symmetric. EKG nonspecific for ischemia/infarction. No dysrhythmias, brugada, WPW, prolonged QT noted.   Troponin negative x2. CXR reviewed. Labs without demonstration of acute pathology unless otherwise noted above.   Given the extremely low risk of these diagnoses further testing and evaluation for these possibilities does not appear to be indicated at this time. Patient in no distress and overall condition improved  here in the ED. Detailed discussions were had with the patient regarding current findings, and need for close f/u with PCP or on call doctor. The patient has been instructed to return immediately if the symptoms worsen in any way for re-evaluation. Patient verbalized understanding and is in agreement with current care plan. All questions answered prior to discharge.                Additional history obtained: -Additional history obtained from spouse -External records from outside source obtained and reviewed including: Chart review including previous notes, labs, imaging, consultation notes including  Prior stress test, home medications   Lab Tests: -I ordered, reviewed, and interpreted labs.   The pertinent results include:   Labs Reviewed  BASIC METABOLIC PANEL WITH GFR - Abnormal; Notable for the following components:      Result Value   Creatinine, Ser 1.08 (*)    GFR, Estimated 55 (*)    All other components within normal limits  CBC  TROPONIN T, HIGH SENSITIVITY  TROPONIN T, HIGH SENSITIVITY    Notable for labs stable  EKG   EKG Interpretation Date/Time:  Thursday July 09 2024 11:13:34 EST Ventricular Rate:  89 PR Interval:  153 QRS Duration:  87 QT Interval:  356 QTC Calculation: 434 R Axis:   -43  Text Interpretation: Sinus rhythm Probable left atrial enlargement Left axis deviation Low voltage, precordial leads Confirmed by Elnor Jayson ([id number]) on 07/09/2024 12:35:59 PM         Imaging Studies ordered: I ordered imaging studies including CXR, CTPE I independently visualized the following imaging with scope of interpretation limited to determining acute life threatening conditions related to emergency care; findings noted above I agree with the radiologist interpretation If any imaging was obtained with contrast I closely monitored patient for any possible adverse reaction a/w contrast administration in the emergency department   Medicines ordered  and prescription drug management: Meds ordered this encounter  Medications   sodium chloride  0.9 % bolus 1,000 mL   iohexol  (OMNIPAQUE ) 350 MG/ML injection 75 mL    -I have reviewed the patients home medicines and have made adjustments as needed   Consultations Obtained: Not applicable  Cardiac Monitoring: The patient was maintained on a cardiac monitor.  I personally viewed and interpreted the cardiac monitored which showed an underlying rhythm of: nsr Continuous pulse oximetry interpreted by myself, 98% on RA.    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      Dispostion: Disposition decision including need for hospitalization was considered, and patient discharged from emergency department.    Final Clinical Impression(s) / ED Diagnoses Final diagnoses:  Chest pain, unspecified type        Elnor Jayson LABOR, DO 07/10/24 1634

## 2024-07-17 ENCOUNTER — Ambulatory Visit: Attending: Physician Assistant | Admitting: Physician Assistant

## 2024-07-17 VITALS — BP 110/70 | HR 86 | Ht 68.5 in | Wt 165.2 lb

## 2024-07-17 DIAGNOSIS — R072 Precordial pain: Secondary | ICD-10-CM

## 2024-07-17 MED ORDER — METOPROLOL TARTRATE 50 MG PO TABS: 50.0000 mg | ORAL_TABLET | Freq: Once | ORAL | 0 refills | Status: AC

## 2024-07-17 NOTE — Patient Instructions (Addendum)
 Medication Instructions:  Your physician recommends that you continue on your current medications as directed. Please refer to the Current Medication list given to you today.  *If you need a refill on your cardiac medications before your next appointment, please call your pharmacy*  Lab Work: None ordered  If you have labs (blood work) drawn today and your tests are completely normal, you will receive your results only by: MyChart Message (if you have MyChart) OR A paper copy in the mail If you have any lab test that is abnormal or we need to change your treatment, we will call you to review the results.  Testing/Procedures: Your physician has requested that you have cardiac CT. Cardiac computed tomography (CT) is a painless test that uses an x-ray machine to take clear, detailed pictures of your heart. For further information please visit [email or web address] Please follow instruction sheet BELOW:    Your cardiac CT will be scheduled at one of the below locations:   [facility] [address] [place], [place] [address] [phone] (Severe contrast allergies only)  OR   [facility] [address] [place], [place] [address] [phone]  OR   [facility] [address] [place], [place] [address] [phone]  OR   [facility] [address]  [place], [place] [address]  OR   [facility] [address] [place], [place] [phone]  If scheduled at [facility], please arrive at the [facility] and Children's Entrance (Entrance C2) of [facility] 30 minutes prior to test start time. You can use the FREE valet parking offered at entrance C (encouraged to control the heart rate for the test)  Proceed to the [facility] Radiology Department (first floor) to check-in and test prep.  All radiology patients and guests should use entrance C2 at [facility], accessed  from [facility], even though the hospital's physical address listed is [address].  If scheduled at the Heart and Vascular Tower at [address], please enter the parking lot using the [address] entrance and use the FREE valet service at the patient drop-off area. Enter the building and check-in with registration on the main floor.  If scheduled at [facility], please arrive to the Heart and Vascular Center 15 mins early for check-in and test prep.  There is spacious parking and easy access to the radiology department from the [facility] Heart and Vascular entrance. Please enter here and check-in with the desk attendant.   If scheduled at [facility], please arrive 30 minutes early for check-in and test prep.  Please follow these instructions carefully (unless otherwise directed):  An IV will be required for this test and Nitroglycerin will be given.    On the Night Before the Test: Be sure to Drink plenty of water. Do not consume any caffeinated/decaffeinated beverages or chocolate 12 hours prior to your test. Do not take any antihistamines 12 hours prior to your test.   On the Day of the Test: Drink plenty of water until 1 hour prior to the test. Do not eat any food 1 hour prior to test. You may take your regular medications prior to the test.  Take metoprolol (Lopressor) 50 MG two hours prior to test.  THIS HAS BEEN SENT TO [facility]- please wear underwire-free bra if available, avoid dresses & tight clothing        After the  Test: Drink plenty of water. After receiving IV contrast, you may experience a mild flushed feeling. This is normal. On occasion, you may experience a mild rash up to 24 hours after the test. This is not dangerous. If this occurs, you can take Benadryl  25 mg, Zyrtec , Claritin , or Allegra and increase your fluid intake. (Patients taking Tikosyn should avoid Benadryl , and may take Zyrtec , Claritin ,  or Allegra) If you experience trouble breathing, this can be serious. If it is severe call 911 IMMEDIATELY. If it is mild, please call our office.  We will call to schedule your test 2-4 weeks out understanding that some insurance companies will need an authorization prior to the service being performed.   For more information and frequently asked questions, please visit our website : [email or web address]  For non-scheduling related questions, please contact the cardiac imaging nurse navigator should you have any questions/concerns: Cardiac Imaging Nurse Navigators Direct Office Dial: [phone]   For scheduling needs, including cancellations and rescheduling, please call Brittany, [phone].   Follow-Up: At [facility], you and your health needs are our priority.  As part of our continuing mission to provide you with exceptional heart care, our providers are all part of one team.  This team includes your primary Cardiologist (physician) and Advanced Practice Providers or APPs (Physician Assistants and Nurse Practitioners) who all work together to provide you with the care you need, when you need it.  Your next appointment:   DEPENDING UPON RESULTS  Provider:    Dr. Ren   We recommend signing up for the patient portal called MyChart.  Sign up information is provided on this After Visit Summary.  MyChart is used to connect with patients for Virtual Visits (Telemedicine).  Patients are able to view lab/test results, encounter notes, upcoming appointments, etc.  Non-urgent messages can be sent to your provider as well.   To learn more about what you can do with MyChart, go to [email or web address].   Other Instructions    If your palpitations worsen, call us  and we will order a monitor.

## 2024-07-17 NOTE — Progress Notes (Unsigned)
 Cardiology Office Note   Date:  07/19/2024  ID:  Christy Kelly, DOB February 04, 1951, MRN [id number] PCP: [name]  [facility] Providers Cardiologist:  [facility]   History of Present Illness Christy Kelly is a 73 y.o. female with past medical history of hyperlipidemia and scoliosis.  Patient was previously seen by Dr. Debera on 12/13/2016 for evaluation of chest soreness.  The characteristic with the chest pain was atypical.  Patient's father died in his late 69s after several stroke but suggestive of premature atherosclerosis.  A stress echocardiogram was recommended and ultimately performed on 12/19/2016 which was negative for ischemia, EF 65 to 70% during stress, no regional wall motion abnormalities.  She has been seen by Dr. Timmy of hematology/oncology service for iron  deficiency anemia.  More recently, patient presented to the ED on 07/09/2024 with chest pain.  Blood work showed stable renal function with creatinine of 1.08, GFR of 55.  Hemoglobin of 14.9, normal white blood cell count.  Serial troponin negative x 2.  Chest x-ray was negative for acute process.  CT angiogram of the chest was negative for PE, tiny amount of left pleural effusion with associated atelectasis changes, aortic atherosclerosis.  Patient recently underwent endoscopy and colonoscopy and received antibiotic treatment for H. pylori.  Patient presents today for [facility] evaluation of chest discomfort.  Symptoms started last Tuesday after she has taken the antibiotic for H. pylori, she described as substernal chest pain that lasted all day.  Symptoms still was present Wednesday morning.  By Thursday, her husband made her go to the emergency room.  Workup was reassuring.  She also complains of occasional transient fluttering sensation that lasts a few seconds each time and appears to be fairly rare.  For the time being, I recommend a coronary CTA to further assess, if coronary CTA come  back negative for any CAD, she can follow-up with cardiology service as needed.  She will need a 50 mg single tablet of metoprolol  tartrate about 2 hours prior to the coronary CTA.  If the coronary CTA does show mild to moderate disease, I will set her up to follow-up with Dr. Ren.  If severe disease is seen, then we will try to bring her back earlier for reassessment.  For the time being given the transient nature of her palpitation I decided to hold off on heart monitor.  She is aware to contact cardiology service if her palpitation worsens  ROS:   Patient had a recent chest pain, denies any shortness of breath, lower extremity edema, orthopnea or PND.  Studies Reviewed      Cardiac Studies & Procedures   ______________________________________________________________________________________________   STRESS TESTS  ECHOCARDIOGRAM STRESS TEST 12/19/2016  Interpretation Summary *[facility]* [address] [place], [place] [address] [phone]  ------------------------------------------------------------------- Stress Echocardiography  Patient:    Christy, Kelly MR #:       [id number] Study Date: 12/19/2016 Gender:     F Age:        23 Height:     172.7 cm Weight:     66.8 kg BSA:        1.79 m^2 Pt. Status: Room:  ATTENDING    Jayson Debera, M.D. ORDERING     Jayson Debera, M.D. REFERRING    Jayson Debera, M.D. PERFORMING   [facility], Zelda Salmon SONOGRAPHER  Aida Pizza, RCS  cc:  -------------------------------------------------------------------  ------------------------------------------------------------------- Indications:      Atypical Chest Pain.  Chest pain 786.51.  ------------------------------------------------------------------- History:   PMH:   Angina pectoris.  Risk factors:  Dyslipidemia.  ------------------------------------------------------------------- Study Conclusions  - Stress ECG conclusions: The stress ECG was  negative for ischemia. Duke scoring: exercise time of 10.5 min; maximum ST deviation of 0 mm; no angina; resulting score is 11. This score predicts a low risk of cardiac events. - Staged echo: There was no echocardiographic evidence for stress-induced ischemia.  ------------------------------------------------------------------- Study data:   Study status:  Routine.  Consent:  The risks, benefits, and alternatives to the procedure were explained to the patient and informed consent was obtained.  Procedure:  The patient reported no pain pre or post test. Initial setup. The patient was brought to the laboratory. A baseline ECG was recorded. Surface ECG leads and automatic cuff blood pressure measurements were monitored. Treadmill exercise testing was performed using the Bruce protocol. The patient exercised for 10 min 28 sec, to protocol stage 4, to a maximal work rate of 10.9 mets. Exercise was terminated due to achievement of target heart rate. The patient was positioned for image acquisition and recovery monitoring. Transthoracic stress echocardiography. Images were captured at baseline and peak exercise.  Study completion:  The patient tolerated the procedure well. There were no complications. Bruce protocol. Stress echocardiography.  Birthdate:  Patient birthdate: 09-14-1950.  Age:  Patient is 73 yr old.  Sex:  Gender: female.    BMI: 22.4 kg/m^2.  Blood pressure:     130/91  Patient status:  Inpatient.  Study date:  Study date: 12/19/2016. Study time: 10:26 AM.  -------------------------------------------------------------------  ------------------------------------------------------------------- Baseline ECG:   Normal sinus rhythm.  ------------------------------------------------------------------- Stress protocol:  +---------------------+---+------------+--------+ !Stage                !HR !BP (mmHg)    !Symptoms! +---------------------+---+------------+--------+ !Baseline             !98 !130/91 (104)!None    ! +---------------------+---+------------+--------+ !Stage 1              !131!156/83 (107)!--------! +---------------------+---+------------+--------+ !Stage 2              !148!158/52 (87) !--------! +---------------------+---+------------+--------+ !Stage 3              !164!174/62 (99) !--------! +---------------------+---+------------+--------+ !Stage 4              !118!166/61 (96) !--------! +---------------------+---+------------+--------+ !Immediate post stress!97 !115/86 (96) !--------! +---------------------+---+------------+--------+  ------------------------------------------------------------------- Stress results:   Maximal heart rate during stress was 164 bpm (104% of maximal predicted heart rate). The maximal predicted heart rate was 157 bpm.The target heart rate was achieved. There was resting hypertension with a hypertensive response to stress. The rate-pressure product for the peak heart rate and blood pressure was 71463 mm Hg/min.  The patient experienced no chest pain during stress.  ------------------------------------------------------------------- Stress ECG:   Sinus tachycardia. No diagnostic ST segment changes to indicate ischemia. Rare PVC noted.  The stress ECG was negative for ischemia.   Duke scoring: exercise time of 10.5 min; maximum ST deviation of 0 mm; no angina; resulting score is 11. This score predicts a low risk of cardiac events.  ------------------------------------------------------------------- Baseline:  - LV size was normal. - LV global systolic function was normal. The estimated LV ejection fraction was 55-60%. - Normal wall motion; no LV regional wall motion abnormalities.  Peak stress:  - LV size was reduced appropriately. - LV global systolic function was vigorous. The estimated LV ejection fraction was 65-70%. - No  evidence for new  LV regional wall motion abnormalities.  ------------------------------------------------------------------- Stress echo results:     There was no echocardiographic evidence for stress-induced ischemia.  ------------------------------------------------------------------- Prepared and Electronically Authenticated by  Jayson Sierras, M.D. 2018-05-17T08:38:27            ______________________________________________________________________________________________      Risk Assessment/Calculations          Physical Exam VS:  BP 110/70   Pulse 86   Ht 5' 8.5 (1.74 m)   Wt 165 lb 3.2 oz (74.9 kg)   SpO2 98%   BMI 24.75 kg/m        Wt Readings from Last 3 Encounters:  07/17/24 165 lb 3.2 oz (74.9 kg)  07/09/24 158 lb (71.7 kg)  12/26/23 161 lb (73 kg)    GEN: Well nourished, well developed in no acute distress NECK: No JVD; No carotid bruits CARDIAC: RRR, no murmurs, rubs, gallops RESPIRATORY:  Clear to auscultation without rales, wheezing or rhonchi  ABDOMEN: Soft, non-tender, non-distended EXTREMITIES:  No edema; No deformity   ASSESSMENT AND PLAN  Chest pain: Will proceed with coronary CTA.  Patient will need a single 50 mg tablet of metoprolol .  If coronary CTA come back negative, patient can follow-up as needed.       Dispo: If coronary CT come back negative, patient can follow-up as needed.  Signed, Scot Ford, PA

## 2024-08-03 ENCOUNTER — Encounter (HOSPITAL_COMMUNITY): Payer: Self-pay

## 2024-08-04 ENCOUNTER — Ambulatory Visit (HOSPITAL_COMMUNITY)

## 2024-08-05 ENCOUNTER — Ambulatory Visit (HOSPITAL_COMMUNITY)
Admission: RE | Admit: 2024-08-05 | Discharge: 2024-08-05 | Disposition: A | Discharge status: Home / Self Care | Source: Ambulatory Visit | Attending: Student in an Organized Health Care Education/Training Program | Admitting: Student in an Organized Health Care Education/Training Program

## 2024-08-05 DIAGNOSIS — R072 Precordial pain: Secondary | ICD-10-CM | POA: Insufficient documentation

## 2024-08-05 MED ORDER — IOHEXOL 350 MG/ML SOLN
100.0000 mL | Freq: Once | INTRAVENOUS | Status: AC | PRN
  Administered 2024-08-05: 100 mL via INTRAVENOUS

## 2024-08-05 MED ORDER — NITROGLYCERIN 0.4 MG SL SUBL
0.8000 mg | SUBLINGUAL_TABLET | Freq: Once | SUBLINGUAL | Status: AC
  Administered 2024-08-05: 0.8 mg via SUBLINGUAL

## 2024-08-10 ENCOUNTER — Ambulatory Visit: Payer: Self-pay | Admitting: Physician Assistant

## 2024-08-14 ENCOUNTER — Encounter: Payer: Self-pay | Admitting: Hematology & Oncology

## 2024-10-09 ENCOUNTER — Encounter (HOSPITAL_BASED_OUTPATIENT_CLINIC_OR_DEPARTMENT_OTHER): Payer: Self-pay

## 2024-10-09 ENCOUNTER — Ambulatory Visit (HOSPITAL_BASED_OUTPATIENT_CLINIC_OR_DEPARTMENT_OTHER): Admit: 2024-10-09 | Admitting: Orthopedic Surgery

## 2024-11-11 ENCOUNTER — Encounter: Payer: Self-pay | Admitting: Family Medicine
